# Patient Record
Sex: Female | Born: 1995 | Race: Black or African American | Hispanic: No | Marital: Single | Smoking: Never smoker
Health system: Southern US, Community
[De-identification: ages and names within clinical notes are randomized; demographics above are authoritative.]

## PROBLEM LIST (undated history)

## (undated) DIAGNOSIS — G43909 Migraine, unspecified, not intractable, without status migrainosus: Secondary | ICD-10-CM

## (undated) DIAGNOSIS — R45851 Suicidal ideations: Secondary | ICD-10-CM

## (undated) DIAGNOSIS — R519 Headache, unspecified: Secondary | ICD-10-CM

## (undated) DIAGNOSIS — F4310 Post-traumatic stress disorder, unspecified: Secondary | ICD-10-CM

## (undated) DIAGNOSIS — R51 Headache: Secondary | ICD-10-CM

## (undated) DIAGNOSIS — F329 Major depressive disorder, single episode, unspecified: Secondary | ICD-10-CM

## (undated) DIAGNOSIS — M419 Scoliosis, unspecified: Secondary | ICD-10-CM

## (undated) DIAGNOSIS — F419 Anxiety disorder, unspecified: Secondary | ICD-10-CM

## (undated) HISTORY — DX: Suicidal ideations: R45.851

## (undated) HISTORY — DX: Major depressive disorder, single episode, unspecified: F32.9

## (undated) HISTORY — DX: Headache, unspecified: R51.9

## (undated) HISTORY — DX: Post-traumatic stress disorder, unspecified: F43.10

## (undated) HISTORY — DX: Migraine, unspecified, not intractable, without status migrainosus: G43.909

## (undated) HISTORY — DX: Anxiety disorder, unspecified: F41.9

## (undated) HISTORY — DX: Headache: R51

---
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
## 2017-01-01 ENCOUNTER — Encounter (HOSPITAL_COMMUNITY): Payer: Self-pay

## 2017-01-01 ENCOUNTER — Emergency Department (HOSPITAL_COMMUNITY)
Admission: EM | Admit: 2017-01-01 | Discharge: 2017-01-01 | Disposition: A | Discharge status: Home / Self Care | Payer: Federal, State, Local not specified - PPO | Attending: Emergency Medicine | Admitting: Emergency Medicine

## 2017-01-01 DIAGNOSIS — R1030 Lower abdominal pain, unspecified: Secondary | ICD-10-CM | POA: Insufficient documentation

## 2017-01-01 DIAGNOSIS — F17200 Nicotine dependence, unspecified, uncomplicated: Secondary | ICD-10-CM | POA: Insufficient documentation

## 2017-01-01 LAB — COMPREHENSIVE METABOLIC PANEL
ALBUMIN: 3.8 g/dL (ref 3.5–5.0)
ALT: 12 U/L — ABNORMAL LOW (ref 14–54)
ANION GAP: 6 (ref 5–15)
AST: 19 U/L (ref 15–41)
Alkaline Phosphatase: 51 U/L (ref 38–126)
BUN: 9 mg/dL (ref 6–20)
CHLORIDE: 104 mmol/L (ref 101–111)
CO2: 26 mmol/L (ref 22–32)
Calcium: 9.1 mg/dL (ref 8.9–10.3)
Creatinine, Ser: 0.8 mg/dL (ref 0.44–1.00)
GFR calc non Af Amer: >60 mL/min (ref >60)
GLUCOSE: 97 mg/dL (ref 65–99)
Potassium: 4.1 mmol/L (ref 3.5–5.1)
SODIUM: 136 mmol/L (ref 135–145)
Total Bilirubin: 0.6 mg/dL (ref 0.3–1.2)
Total Protein: 7.4 g/dL (ref 6.5–8.1)

## 2017-01-01 LAB — URINALYSIS, ROUTINE W REFLEX MICROSCOPIC
Bilirubin Urine: NEGATIVE
Glucose, UA: NEGATIVE mg/dL
HGB URINE DIPSTICK: NEGATIVE
Ketones, ur: NEGATIVE mg/dL
Leukocytes, UA: NEGATIVE
Nitrite: NEGATIVE
PH: 6 (ref 5.0–8.0)
Protein, ur: NEGATIVE mg/dL
SPECIFIC GRAVITY, URINE: 1.02 (ref 1.005–1.030)

## 2017-01-01 LAB — I-STAT BETA HCG BLOOD, ED (MC, WL, AP ONLY): I-stat hCG, quantitative: <5 m[IU]/mL (ref <5)

## 2017-01-01 LAB — CBC
HCT: 39 % (ref 36.0–46.0)
HEMOGLOBIN: 12.7 g/dL (ref 12.0–15.0)
MCH: 25.1 pg — AB (ref 26.0–34.0)
MCHC: 32.6 g/dL (ref 30.0–36.0)
MCV: 77.1 fL — ABNORMAL LOW (ref 78.0–100.0)
Platelets: 192 10*3/uL (ref 150–400)
RBC: 5.06 MIL/uL (ref 3.87–5.11)
RDW: 14.3 % (ref 11.5–15.5)
WBC: 5.2 10*3/uL (ref 4.0–10.5)

## 2017-01-01 LAB — LIPASE, BLOOD: Lipase: 29 U/L (ref 11–51)

## 2017-01-01 MED ORDER — KETOROLAC TROMETHAMINE 30 MG/ML IJ SOLN
15.0000 mg | Freq: Once | INTRAMUSCULAR | Status: AC
  Administered 2017-01-01: 15 mg via INTRAMUSCULAR
  Filled 2017-01-01: qty 1

## 2017-01-01 MED ORDER — TRAMADOL HCL 50 MG PO TABS
50.0000 mg | ORAL_TABLET | Freq: Once | ORAL | Status: AC
  Administered 2017-01-01: 50 mg via ORAL
  Filled 2017-01-01: qty 1

## 2017-01-01 MED ORDER — TRAMADOL HCL 50 MG PO TABS: 50.0000 mg | ORAL_TABLET | Freq: Four times a day (QID) | ORAL | 0 refills | Status: DC | PRN

## 2017-01-01 NOTE — Discharge Instructions (Signed)
As discussed, today's evaluation has been generally r reassuring, but with concern for your abdominal pain, it is very important he follow-up with our [facility] colleagues.  When you contact our [facility] clinic tomorrow, please be sure to inform them that you were seen in the emergency department today for lower abdominal pain, and was consideration of your ongoing dysmenorrhea, or irregular menstrual cycle the recommendation is for follow-up and consideration of pelvic ultrasound with our gynecology colleagues.  In addition, please be sure to follow a gluten-free diet, and follow-up with our gastroenterology colleagues.  Return here for concerning changes in your condition.
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
## 2017-01-01 NOTE — ED Triage Notes (Signed)
Pt states that she has had lower abd pain for the two weeks, RLQ, with some nausea, pt states that she has not had a period in four months, c/o of dysuria and abnormal discharge.

## 2017-01-10 ENCOUNTER — Encounter (HOSPITAL_COMMUNITY): Payer: Self-pay | Admitting: *Deleted

## 2017-01-10 ENCOUNTER — Emergency Department (HOSPITAL_COMMUNITY)
Admission: EM | Admit: 2017-01-10 | Discharge: 2017-01-10 | Disposition: A | Discharge status: Home / Self Care | Payer: Federal, State, Local not specified - PPO | Attending: Emergency Medicine | Admitting: Emergency Medicine

## 2017-01-10 ENCOUNTER — Emergency Department (HOSPITAL_COMMUNITY): Payer: Federal, State, Local not specified - PPO

## 2017-01-10 DIAGNOSIS — F17210 Nicotine dependence, cigarettes, uncomplicated: Secondary | ICD-10-CM | POA: Diagnosis not present

## 2017-01-10 DIAGNOSIS — M545 Low back pain: Secondary | ICD-10-CM | POA: Insufficient documentation

## 2017-01-10 DIAGNOSIS — M542 Cervicalgia: Secondary | ICD-10-CM

## 2017-01-10 DIAGNOSIS — Z041 Encounter for examination and observation following transport accident: Secondary | ICD-10-CM | POA: Diagnosis not present

## 2017-01-10 MED ORDER — IBUPROFEN 400 MG PO TABS
600.0000 mg | ORAL_TABLET | Freq: Once | ORAL | Status: AC
  Administered 2017-01-10: 600 mg via ORAL
  Filled 2017-01-10: qty 1

## 2017-01-10 MED ORDER — CYCLOBENZAPRINE HCL 10 MG PO TABS: 10.0000 mg | ORAL_TABLET | Freq: Two times a day (BID) | ORAL | 0 refills | Status: DC | PRN

## 2017-01-10 NOTE — ED Notes (Signed)
Patient transported to CT 
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
## 2017-01-10 NOTE — Discharge Instructions (Signed)
Please read attached information. If you experience any new or worsening signs or symptoms please return to the emergency room for evaluation. Please follow-up with your primary care provider or specialist as discussed. Please use medication prescribed only as directed and discontinue taking if you have any concerning signs or symptoms.   °

## 2017-01-10 NOTE — ED Notes (Signed)
Pt verbalized understanding discharge instructions and denies any further needs or questions at this time. VS stable, ambulatory and steady gait.   

## 2017-11-01 ENCOUNTER — Encounter (HOSPITAL_COMMUNITY): Payer: Self-pay

## 2017-11-01 ENCOUNTER — Emergency Department (HOSPITAL_COMMUNITY): Payer: Federal, State, Local not specified - PPO

## 2017-11-01 ENCOUNTER — Emergency Department (HOSPITAL_COMMUNITY)
Admission: EM | Admit: 2017-11-01 | Discharge: 2017-11-02 | Disposition: A | Discharge status: Home / Self Care | Payer: Federal, State, Local not specified - PPO | Attending: Emergency Medicine | Admitting: Emergency Medicine

## 2017-11-01 ENCOUNTER — Ambulatory Visit (HOSPITAL_COMMUNITY)
Admission: EM | Admit: 2017-11-01 | Discharge: 2017-11-01 | Disposition: A | Discharge status: Home / Self Care | Payer: Federal, State, Local not specified - PPO

## 2017-11-01 DIAGNOSIS — R51 Headache: Secondary | ICD-10-CM | POA: Insufficient documentation

## 2017-11-01 DIAGNOSIS — Z79899 Other long term (current) drug therapy: Secondary | ICD-10-CM | POA: Insufficient documentation

## 2017-11-01 DIAGNOSIS — R519 Headache, unspecified: Secondary | ICD-10-CM

## 2017-11-01 DIAGNOSIS — R079 Chest pain, unspecified: Secondary | ICD-10-CM | POA: Insufficient documentation

## 2017-11-01 DIAGNOSIS — F17210 Nicotine dependence, cigarettes, uncomplicated: Secondary | ICD-10-CM | POA: Insufficient documentation

## 2017-11-01 DIAGNOSIS — Z7982 Long term (current) use of aspirin: Secondary | ICD-10-CM | POA: Diagnosis not present

## 2017-11-01 DIAGNOSIS — R45851 Suicidal ideations: Secondary | ICD-10-CM | POA: Diagnosis not present

## 2017-11-01 DIAGNOSIS — F331 Major depressive disorder, recurrent, moderate: Secondary | ICD-10-CM | POA: Diagnosis present

## 2017-11-01 LAB — BASIC METABOLIC PANEL
ANION GAP: 9 (ref 5–15)
BUN: 10 mg/dL (ref 6–20)
CALCIUM: 9.4 mg/dL (ref 8.9–10.3)
CO2: 26 mmol/L (ref 22–32)
Chloride: 103 mmol/L (ref 98–111)
Creatinine, Ser: 0.93 mg/dL (ref 0.44–1.00)
GFR calc Af Amer: >60 mL/min (ref >60)
Glucose, Bld: 91 mg/dL (ref 70–99)
Potassium: 4 mmol/L (ref 3.5–5.1)
Sodium: 138 mmol/L (ref 135–145)

## 2017-11-01 LAB — I-STAT TROPONIN, ED: TROPONIN I, POC: 0.02 ng/mL (ref 0.00–0.08)

## 2017-11-01 LAB — CBC
HCT: 42.4 % (ref 36.0–46.0)
HEMOGLOBIN: 13 g/dL (ref 12.0–15.0)
MCH: 24.4 pg — AB (ref 26.0–34.0)
MCHC: 30.7 g/dL (ref 30.0–36.0)
MCV: 79.5 fL (ref 78.0–100.0)
PLATELETS: 231 10*3/uL (ref 150–400)
RBC: 5.33 MIL/uL — ABNORMAL HIGH (ref 3.87–5.11)
RDW: 13.7 % (ref 11.5–15.5)
WBC: 5.2 10*3/uL (ref 4.0–10.5)

## 2017-11-01 LAB — I-STAT BETA HCG BLOOD, ED (MC, WL, AP ONLY)

## 2017-11-01 NOTE — ED Notes (Signed)
Pt called this RN back into room prior to going to lobby, states that she was not honest during questionnaire and that she often has thoughts of harming herself, has no plan, denies HI/AVH. Pt states that she has trouble getting out of the bed, has been diagnosed with depression and is not currently on any medication.

## 2017-11-01 NOTE — ED Notes (Signed)
Belongings placed in locker 3 (Pod F)

## 2017-11-01 NOTE — ED Triage Notes (Signed)
Pt states that for the past two weeks she has been having migraines with photosensitivity and nausea, sharp shooting CP, denies SOB, lower abd pain, has not had a period in 4 months but has not taken a pregnancy test.

## 2017-11-02 ENCOUNTER — Encounter (HOSPITAL_COMMUNITY): Payer: Self-pay | Admitting: Registered Nurse

## 2017-11-02 ENCOUNTER — Emergency Department (HOSPITAL_COMMUNITY): Payer: Federal, State, Local not specified - PPO

## 2017-11-02 LAB — RAPID URINE DRUG SCREEN, HOSP PERFORMED
AMPHETAMINES: NOT DETECTED
BARBITURATES: NOT DETECTED
Benzodiazepines: NOT DETECTED
Cocaine: NOT DETECTED
Opiates: NOT DETECTED
TETRAHYDROCANNABINOL: NOT DETECTED

## 2017-11-02 LAB — TSH: TSH: 5.477 u[IU]/mL — AB (ref 0.350–4.500)

## 2017-11-02 LAB — ETHANOL

## 2017-11-02 LAB — T4, FREE: FREE T4: 1.14 ng/dL (ref 0.82–1.77)

## 2017-11-02 LAB — SALICYLATE LEVEL: Salicylate Lvl: <7 mg/dL (ref 2.8–30.0)

## 2017-11-02 LAB — ACETAMINOPHEN LEVEL: Acetaminophen (Tylenol), Serum: <10 ug/mL — ABNORMAL LOW (ref 10–30)

## 2017-11-02 MED ORDER — KETOROLAC TROMETHAMINE 60 MG/2ML IM SOLN
60.0000 mg | Freq: Once | INTRAMUSCULAR | Status: AC
  Administered 2017-11-02: 60 mg via INTRAMUSCULAR
  Filled 2017-11-02: qty 2

## 2017-11-02 MED ORDER — DIPHENHYDRAMINE HCL 25 MG PO CAPS
25.0000 mg | ORAL_CAPSULE | Freq: Once | ORAL | Status: AC
  Administered 2017-11-02: 25 mg via ORAL
  Filled 2017-11-02: qty 1

## 2017-11-02 MED ORDER — PROCHLORPERAZINE MALEATE 5 MG PO TABS
10.0000 mg | ORAL_TABLET | Freq: Once | ORAL | Status: AC
  Administered 2017-11-02: 10 mg via ORAL
  Filled 2017-11-02: qty 2
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
## 2017-11-02 NOTE — ED Notes (Signed)
TTS bedside 

## 2017-11-02 NOTE — ED Notes (Signed)
Patient's friend called to get key for her house to let her dog out. Patient gave verbal consent for [facility] school teachernurse secretary, to go to locker 3 in pod F to obtain key to house and give said key to her friend.

## 2017-11-02 NOTE — ED Notes (Signed)
Discharge instructions discussed with Pt. Pt verbalized understanding. Pt stable and ambulatory.    

## 2017-11-02 NOTE — ED Notes (Signed)
Per Dr. Dalene SeltzerSchlossman, it is okay for patient to eat. Will put in diet order and give patient Malaw[place]key sandwich while waiting for tray from cafeteria.
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
## 2017-11-02 NOTE — ED Notes (Signed)
Patient transported to CT 

## 2017-11-02 NOTE — ED Notes (Signed)
Belongings envelope opened so pt friend could take house keys with her, new envelope created with a new pt signature and taken to security.

## 2017-11-02 NOTE — ED Notes (Signed)
Pt returned from CT °

## 2017-11-02 NOTE — ED Notes (Signed)
This RN attempting to assess patient asked the question, "Do you feel like hurting yourself at this moment?" Patient states no. RN asked "Has anything happened at home recently that could cause you to be stressed?" Patient stated "I don't want to talk about that right now." Currently denies SI/HI.

## 2017-11-02 NOTE — Consult Note (Signed)
Tele psych Assessment   Sherwood GamblerJasmine [name], 22 y.o., female patient seen via tele psych by TTS and this provider; chart reviewed and consulted with Dr. Lucianne MussKumar on 11/02/17.  On evaluation [name] [name] reports that she does have some depression and school is the major stressor.  "They only let me take 12 credit hours this semester and that is going to make it so I can't graduated this year.  I already had plans for graduate school and I had 18 credit hours last semester and made A's and B's so I don't see why they did let me do the 18 credits this semester and the classes are only offered once a year."  Patient states that she came to the hospital because of headache, back pain, and chest pain.  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  Patient states that her mother is supportive and she has one friend on campus but she lives alone in an off campus apartment.  Patient denies suicidal/self-harm/homicidal ideation, psychosis, and paranoia.  Patient is currently seeing a therapist that she has seen 3-4 times.  Other than therapy no prior psychiatric history.  Patient has elevation in TSH.  Patient asked prior issues with her thyroid or family history.  Patient states that she doesn't thing she  Has ever had any problems with thyroid but her grandmother and an aunt has thyroid issues.    During evaluation [name] [name] is alert/oriented x 4; calm/cooperative; and mood is congruent with affect.  She does not appear to be responding to internal/external stimuli or delusional thoughts; and denies suicidal/self-harm/homicidal ideation, psychosis, and paranoia.  Patient continues to complaint of headache having to be in a dark quite room.  States that she has not been given any medication for the pain related to being on psych hold.  Informed patient I would inform EDP of her pain.  Patient also states that she hasn't had a menstrual cycle for 4 months. Lab HCG < 5. [name] [name] unsure if patient has  been informed or results.  Patient states that she doesn't have a physician in area only at home and not sure if campus clinic.  Patient informed to contact her mother; and have check if physician in area covered by insurance so she could follow up with thyroid.  Patient also informed that it would be good to rule out thyroid issues which could also be causing her to feel the way she does; but would still refer to psychiatry.  Patient able to contract for safety and answered question appropriately.  Results for orders placed or performed during the hospital encounter of 11/01/17 (from the past 24 hour(s))  Basic metabolic panel     Status: None   Collection Time: 11/01/17 10:23 PM  Result Value Ref Range   Sodium 138 135 - 145 mmol/L   Potassium 4.0 3.5 - 5.1 mmol/L   Chloride 103 98 - 111 mmol/L   CO2 26 22 - 32 mmol/L   Glucose, Bld 91 70 - 99 mg/dL   BUN 10 6 - 20 mg/dL   Creatinine, Ser 9.600.93 0.44 - 1.00 mg/dL   Calcium 9.4 8.9 - 45.410.3 mg/dL   GFR calc non Af Amer >60 >60 mL/min   GFR calc Af Amer >60 >60 mL/min   Anion gap 9 5 - 15  CBC     Status: Abnormal   Collection Time: 11/01/17 10:23 PM  Result Value Ref Range   WBC 5.2 4.0 - 10.5  K/uL   RBC 5.33 (H) 3.87 - 5.11 MIL/uL   Hemoglobin 13.0 12.0 - 15.0 g/dL   HCT 95.2 84.1 - 32.4 %   MCV 79.5 78.0 - 100.0 fL   MCH 24.4 (L) 26.0 - 34.0 pg   MCHC 30.7 30.0 - 36.0 g/dL   RDW 40.1 02.7 - 25.3 %   Platelets 231 150 - 400 K/uL  Ethanol     Status: None   Collection Time: 11/01/17 10:31 PM  Result Value Ref Range   Alcohol, Ethyl (B) <10 <10 mg/dL  Salicylate level     Status: None   Collection Time: 11/01/17 10:31 PM  Result Value Ref Range   Salicylate Lvl <7.0 2.8 - 30.0 mg/dL  Acetaminophen level     Status: Abnormal   Collection Time: 11/01/17 10:31 PM  Result Value Ref Range   Acetaminophen (Tylenol), Serum <10 (L) 10 - 30 ug/mL  Rapid urine drug screen (hospital performed)     Status: None   Collection Time: 11/01/17  10:31 PM  Result Value Ref Range   Opiates NONE DETECTED NONE DETECTED   Cocaine NONE DETECTED NONE DETECTED   Benzodiazepines NONE DETECTED NONE DETECTED   Amphetamines NONE DETECTED NONE DETECTED   Tetrahydrocannabinol NONE DETECTED NONE DETECTED   Barbiturates NONE DETECTED NONE DETECTED  I-Stat beta hCG blood, ED     Status: None   Collection Time: 11/01/17 10:41 PM  Result Value Ref Range   I-stat hCG, quantitative <5.0 <5 mIU/mL   Comment 3          I-stat troponin, ED     Status: None   Collection Time: 11/01/17 10:42 PM  Result Value Ref Range   Troponin i, poc 0.02 0.00 - 0.08 ng/mL   Comment 3          TSH     Status: Abnormal   Collection Time: 11/02/17  8:47 AM  Result Value Ref Range   TSH 5.477 (H) 0.350 - 4.500 uIU/mL    For detailed note see TTS tele assessment note  Recommendations:  Continue with therapy, referral to psychiatry for medication management after thyroid issues ruled out.  Patient requesting medication for severe headache; if no medical con  Disposition:  Patient is psychiatrically cleared No evidence of imminent risk to self or others at present.   Patient does not meet criteria for psychiatric inpatient admission. Supportive therapy provided about ongoing stressors. Discussed crisis plan, support from social network, calling 911, coming to the Emergency Department, and calling Suicide Hotline.   Attempted to speak with EDP; Dr. Dalene Seltzer who was unable to come to phone at time and was put on hold 11:44 AM - 11:49 AM.  Called to speak with RN of patient and put on hold again 11:50 AM - 11: 52 -11:54 AM.  Sherron Monday with Jenel Lucks, RN who took the phone to Dr. Dalene Seltzer: at which time she was informed of above recommendation and disposition.  Report completed at 12:01 PM   [name] Found, NP

## 2017-11-03 LAB — T3, FREE: T3, Free: 3.5 pg/mL (ref 2.0–4.4)

## 2018-01-29 ENCOUNTER — Encounter: Payer: Self-pay | Admitting: Neurology

## 2018-01-29 ENCOUNTER — Encounter

## 2018-01-29 ENCOUNTER — Encounter: Payer: Self-pay | Admitting: *Deleted

## 2018-01-29 ENCOUNTER — Telehealth: Payer: Self-pay | Admitting: *Deleted

## 2018-01-29 ENCOUNTER — Ambulatory Visit: Payer: Federal, State, Local not specified - PPO | Admitting: Neurology

## 2018-01-29 VITALS — BP 124/79 | HR 86 | Ht 62.0 in | Wt 122.0 lb

## 2018-01-29 DIAGNOSIS — G43711 Chronic migraine without aura, intractable, with status migrainosus: Secondary | ICD-10-CM

## 2018-01-29 MED ORDER — ONDANSETRON 4 MG PO TBDP: 4.0000 mg | ORAL_TABLET | Freq: Three times a day (TID) | ORAL | 3 refills | Status: DC | PRN

## 2018-01-29 MED ORDER — RIZATRIPTAN BENZOATE 10 MG PO TBDP: 10.0000 mg | ORAL_TABLET | ORAL | 11 refills | Status: DC | PRN

## 2018-01-29 MED ORDER — VENLAFAXINE HCL ER 37.5 MG PO TB24: 37.5000 mg | ORAL_TABLET | Freq: Every day | ORAL | 6 refills | Status: DC

## 2018-01-29 NOTE — Patient Instructions (Addendum)
For prevention: Venlafaxine (Effexor): 37.5mg  daily. In 2-3 weeks email me and can increase to 75 I also like these medications for prevention: Topamax and propranolol also good oral meds but can worsen depression Aimovig - once monthly injection (new) at home Botox for migraines - every 3 months come to the office. Has been used for depression as well.  Acute management (when you have a migraine)  Rizatriptan: Please take one tablet at the onset of your headache. If it does not improve the symptoms please take one additional tablet. Do not take more then 2 tablets in 24hrs. Do not take use more then 2 to 3 times in a week. May take with zofran (ondansetron) for nausea.   Venlafaxine tablets What is this medicine? VENLAFAXINE (VEN la fax een) is used to treat depression, anxiety and panic disorder. This medicine may be used for other purposes; ask your health care provider or pharmacist if you have questions. COMMON BRAND NAME(S): Effexor What should I tell my health care provider before I take this medicine? They need to know if you have any of these conditions: -bleeding disorders -glaucoma -heart disease -high blood pressure -high cholesterol -kidney disease -liver disease -low levels of sodium in the blood -mania or bipolar disorder -seizures -suicidal thoughts, plans, or attempt; a previous suicide attempt by you or a family -take medicines that treat or prevent blood clots -thyroid disease -an unusual or allergic reaction to venlafaxine, desvenlafaxine, other medicines, foods, dyes, or preservatives -pregnant or trying to get pregnant -breast-feeding How should I use this medicine? Take this medicine by mouth with a glass of water. Follow the directions on the prescription label. Take it with food. Take your medicine at regular intervals. Do not take your medicine more often than directed. Do not stop taking this medicine suddenly except upon the advice of your doctor.  Stopping this medicine too quickly may cause serious side effects or your condition may worsen. A special MedGuide will be given to you by the pharmacist with each prescription and refill. Be sure to read this information carefully each time. Talk to your pediatrician regarding the use of this medicine in children. Special care may be needed. Overdosage: If you think you have taken too much of this medicine contact a poison control center or emergency room at once. NOTE: This medicine is only for you. Do not share this medicine with others. What if I miss a dose? If you miss a dose, take it as soon as you can. If it is almost time for your next dose, take only that dose. Do not take double or extra doses. What may interact with this medicine? Do not take this medicine with any of the following medications: -certain medicines for fungal infections like fluconazole, itraconazole, ketoconazole, posaconazole, voriconazole -cisapride -desvenlafaxine -dofetilide -dronedarone -duloxetine -levomilnacipran -linezolid -MAOIs like Carbex, Eldepryl, Marplan, Nardil, and Parnate -methylene blue (injected into a vein) -milnacipran -pimozide -thioridazine -ziprasidone This medicine may also interact with the following medications: -amphetamines -aspirin and aspirin-like medicines -certain medicines for depression, anxiety, or psychotic disturbances -certain medicines for migraine headaches like almotriptan, eletriptan, frovatriptan, naratriptan, rizatriptan, sumatriptan, zolmitriptan -certain medicines for sleep -certain medicines that treat or prevent blood clots like dalteparin, enoxaparin, warfarin -cimetidine -clozapine -diuretics -fentanyl -furazolidone -indinavir -isoniazid -lithium -metoprolol -NSAIDS, medicines for pain and inflammation, like ibuprofen or naproxen -other medicines that prolong the QT interval (cause an abnormal heart rhythm) -procarbazine -rasagiline -supplements  like St. John's wort, kava kava, valerian -tramadol -tryptophan This list may  not describe all possible interactions. Give your health care provider a list of all the medicines, herbs, non-prescription drugs, or dietary supplements you use. Also tell them if you smoke, drink alcohol, or use illegal drugs. Some items may interact with your medicine. What should I watch for while using this medicine? Tell your doctor if your symptoms do not get better or if they get worse. Visit your doctor or health care professional for regular checks on your progress. Because it may take several weeks to see the full effects of this medicine, it is important to continue your treatment as prescribed by your doctor. Patients and their families should watch out for new or worsening thoughts of suicide or depression. Also watch out for sudden changes in feelings such as feeling anxious, agitated, panicky, irritable, hostile, aggressive, impulsive, severely restless, overly excited and hyperactive, or not being able to sleep. If this happens, especially at the beginning of treatment or after a change in dose, call your health care professional. This medicine can cause an increase in blood pressure. Check with your doctor for instructions on monitoring your blood pressure while taking this medicine. You may get drowsy or dizzy. Do not drive, use machinery, or do anything that needs mental alertness until you know how this medicine affects you. Do not stand or sit up quickly, especially if you are an older patient. This reduces the risk of dizzy or fainting spells. Alcohol may interfere with the effect of this medicine. Avoid alcoholic drinks. Your mouth may get dry. Chewing sugarless gum, sucking hard candy and drinking plenty of water will help. Contact your doctor if the problem does not go away or is severe. What side effects may I notice from receiving this medicine? Side effects that you should report to your doctor or  health care professional as soon as possible: -allergic reactions like skin rash, itching or hives, swelling of the face, lips, or tongue -anxious -breathing problems -confusion -changes in vision -chest pain -confusion -elevated mood, decreased need for sleep, racing thoughts, impulsive behavior -eye pain -fast, irregular heartbeat -feeling faint or lightheaded, falls -feeling agitated, angry, or irritable -hallucination, loss of contact with reality -high blood pressure -loss of balance or coordination -palpitations -redness, blistering, peeling or loosening of the skin, including inside the mouth -restlessness, pacing, inability to keep still -seizures -stiff muscles -suicidal thoughts or other mood changes -trouble passing urine or change in the amount of urine -trouble sleeping -unusual bleeding or bruising -unusually weak or tired -vomiting Side effects that usually do not require medical attention (report to your doctor or health care professional if they continue or are bothersome): -change in sex drive or performance -change in appetite or weight -constipation -dizziness -dry mouth -headache -increased sweating -nausea -tired This list may not describe all possible side effects. Call your doctor for medical advice about side effects. You may report side effects to FDA at 1-800-FDA-1088. Where should I keep my medicine? Keep out of the reach of children. Store at a controlled temperature between 20 and 25 degrees C (68 and 77 degrees F), in a dry place. Throw away any unused medicine after the expiration date. NOTE: This sheet is a summary. It may not cover all possible information. If you have questions about this medicine, talk to your doctor, pharmacist, or health care provider.  2018 [employer]/Gold Standard (2015-07-09 18:42:26)  Rizatriptan disintegrating tablets What is this medicine? RIZATRIPTAN (rye za TRIP tan) is used to treat migraines with or without  aura. An  aura is a strange feeling or visual disturbance that warns you of an attack. It is not used to prevent migraines. This medicine may be used for other purposes; ask your health care provider or pharmacist if you have questions. COMMON BRAND NAME(S): Maxalt-MLT What should I tell my health care provider before I take this medicine? They need to know if you have any of these conditions: -bowel disease or colitis -diabetes -family history of heart disease -fast or irregular heart beat -heart or blood vessel disease, angina (chest pain), or previous heart attack -high blood pressure -high cholesterol -history of stroke, transient ischemic attacks (TIAs or mini-strokes), or intracranial bleeding -kidney or liver disease -overweight -poor circulation -postmenopausal or surgical removal of uterus and ovaries -an unusual or allergic reaction to rizatriptan, other medicines, foods, dyes, or preservatives -pregnant or trying to get pregnant -breast-feeding How should I use this medicine? Take this medicine by mouth. Follow the directions on the prescription label. This medicine is taken at the first symptoms of a migraine. It is not for everyday use. Leave the tablet in the foil package until you are ready to take it. Do not push the tablet through the blister pack. Peel open the blister pack with dry hands and place the tablet on your tongue. The tablet will dissolve rapidly and be swallowed in your saliva. It is not necessary to drink any water to take this medicine. If your migraine headache returns after one dose, you can take another dose as directed. You must leave at least 2 hours between doses, and do not take more than 30 mg total in 24 hours. If there is no improvement at all after the first dose, do not take a second dose without talking to your doctor or health care professional. Do not take your medicine more often than directed. Talk to your pediatrician regarding the use of this  medicine in children. While this drug may be prescribed for children as young as 6 years for selected conditions, precautions do apply. Overdosage: If you think you have taken too much of this medicine contact a poison control center or emergency room at once. NOTE: This medicine is only for you. Do not share this medicine with others. What if I miss a dose? This does not apply; this medicine is not for regular use. What may interact with this medicine? Do not take this medicine with any of the following medicines: -amphetamine, dextroamphetamine or cocaine -dihydroergotamine, ergotamine, ergoloid mesylates, methysergide, or ergot-type medication - do not take within 24 hours of taking rizatriptan -feverfew -MAOIs like Carbex, Eldepryl, Marplan, Nardil, and Parnate - do not take rizatriptan within 2 weeks of stopping MAOI therapy. -other migraine medicines like almotriptan, eletriptan, naratriptan, sumatriptan, zolmitriptan - do not take within 24 hours of taking rizatriptan -tryptophan This medicine may also interact with the following medications: -medicines for mental depression, anxiety or mood problems -propranolol This list may not describe all possible interactions. Give your health care provider a list of all the medicines, herbs, non-prescription drugs, or dietary supplements you use. Also tell them if you smoke, drink alcohol, or use illegal drugs. Some items may interact with your medicine. What should I watch for while using this medicine? Only take this medicine for a migraine headache. Take it if you get warning symptoms or at the start of a migraine attack. It is not for regular use to prevent migraine attacks. You may get drowsy or dizzy. Do not drive, use machinery, or do anything that  needs mental alertness until you know how this medicine affects you. To reduce dizzy or fainting spells, do not sit or stand up quickly, especially if you are an older patient. Alcohol can increase  drowsiness, dizziness and flushing. Avoid alcoholic drinks. Smoking cigarettes may increase the risk of heart-related side effects from using this medicine. If you take migraine medicines for 10 or more days a month, your migraines may get worse. Keep a diary of headache days and medicine use. Contact your healthcare professional if your migraine attacks occur more frequently. What side effects may I notice from receiving this medicine? Side effects that you should report to your doctor or health care professional as soon as possible: -allergic reactions like skin rash, itching or hives, swelling of the face, lips, or tongue -fast, slow, or irregular heart beat -increased or decreased blood pressure -seizures -severe stomach pain and cramping, bloody diarrhea -signs and symptoms of a blood clot such as breathing problems; changes in vision; chest pain; severe, sudden headache; pain, swelling, warmth in the leg; trouble speaking; sudden numbness or weakness of the face, arm or leg -tingling, pain, or numbness in the face, hands, or feet Side effects that usually do not require medical attention (report to your doctor or health care professional if they continue or are bothersome): -drowsiness -dry mouth -feeling warm, flushing, or redness of the face -headache -muscle cramps, pain -nausea, vomiting -unusually weak or tired This list may not describe all possible side effects. Call your doctor for medical advice about side effects. You may report side effects to FDA at 1-800-FDA-1088. Where should I keep my medicine? Keep out of the reach of children. Store at room temperature between 15 and 30 degrees C (59 and 86 degrees F). Protect from light and moisture. Throw away any unused medicine after the expiration date. NOTE: This sheet is a summary. It may not cover all possible information. If you have questions about this medicine, talk to your doctor, pharmacist, or health care provider.   2018 Elsevier/Gold Standard (2012-10-09 10:17:42) Ondansetron oral dissolving tablet What is this medicine? ONDANSETRON (on DAN se tron) is used to treat nausea and vomiting caused by chemotherapy. It is also used to prevent or treat nausea and vomiting after surgery. This medicine may be used for other purposes; ask your health care provider or pharmacist if you have questions. COMMON BRAND NAME(S): Zofran ODT What should I tell my health care provider before I take this medicine? They need to know if you have any of these conditions: -heart disease -history of irregular heartbeat -liver disease -low levels of magnesium or potassium in the blood -an unusual or allergic reaction to ondansetron, granisetron, other medicines, foods, dyes, or preservatives -pregnant or trying to get pregnant -breast-feeding How should I use this medicine? These tablets are made to dissolve in the mouth. Do not try to push the tablet through the foil backing. With dry hands, peel away the foil backing and gently remove the tablet. Place the tablet in the mouth and allow it to dissolve, then swallow. While you may take these tablets with water, it is not necessary to do so. Talk to your pediatrician regarding the use of this medicine in children. Special care may be needed. Overdosage: If you think you have taken too much of this medicine contact a poison control center or emergency room at once. NOTE: This medicine is only for you. Do not share this medicine with others. What if I miss a dose? If you  miss a dose, take it as soon as you can. If it is almost time for your next dose, take only that dose. Do not take double or extra doses. What may interact with this medicine? Do not take this medicine with any of the following medications: -apomorphine -certain medicines for fungal infections like fluconazole, itraconazole, ketoconazole, posaconazole,  voriconazole -cisapride -dofetilide -dronedarone -pimozide -thioridazine -ziprasidone This medicine may also interact with the following medications: -carbamazepine -certain medicines for depression, anxiety, or psychotic disturbances -fentanyl -linezolid -MAOIs like Carbex, Eldepryl, Marplan, Nardil, and Parnate -methylene blue (injected into a vein) -other medicines that prolong the QT interval (cause an abnormal heart rhythm) -phenytoin -rifampicin -tramadol This list may not describe all possible interactions. Give your health care provider a list of all the medicines, herbs, non-prescription drugs, or dietary supplements you use. Also tell them if you smoke, drink alcohol, or use illegal drugs. Some items may interact with your medicine. What should I watch for while using this medicine? Check with your doctor or health care professional as soon as you can if you have any sign of an allergic reaction. What side effects may I notice from receiving this medicine? Side effects that you should report to your doctor or health care professional as soon as possible: -allergic reactions like skin rash, itching or hives, swelling of the face, lips, or tongue -breathing problems -confusion -dizziness -fast or irregular heartbeat -feeling faint or lightheaded, falls -fever and chills -loss of balance or coordination -seizures -sweating -swelling of the hands and feet -tightness in the chest -tremors -unusually weak or tired Side effects that usually do not require medical attention (report to your doctor or health care professional if they continue or are bothersome): -constipation or diarrhea -headache This list may not describe all possible side effects. Call your doctor for medical advice about side effects. You may report side effects to FDA at 1-800-FDA-1088. Where should I keep my medicine? Keep out of the reach of children. Store between 2 and 30 degrees C (36 and 86  degrees F). Throw away any unused medicine after the expiration date. NOTE: This sheet is a summary. It may not cover all possible information. If you have questions about this medicine, talk to your doctor, pharmacist, or health care provider.  2018 Elsevier/Gold Standard (2012-11-14 16:21:52)

## 2018-01-29 NOTE — Telephone Encounter (Signed)
Pt no showed new pt appt on 01/29/2018 @ 09:00.

## 2018-01-29 NOTE — Progress Notes (Signed)
[facility]    Provider:  Dr Lucia Gaskins Referring Provider: Maryagnes Amos, FNP Primary Care Physician:  Maryagnes Amos, FNP  CC:  migraines  HPI:  [name] [name] is a 22 y.o. female here as requested by Maryagnes Amos, FNP for headaches.  She has a past medical history of migraines, depression, SI.  She has never smoked, she is a social drinker.  Migraine started 6 to 10 years ago.  They started on the left forehead, pulsating throbbing, "excruciating".  She describes photo and phonophobia, nausea, movement makes it worse, headaches are moderately severe to severe.  Headaches have lasted 24 to 72 hours.  Patient takes Tylenol 3 for her headaches. Migraines are daily now for 6 months. She also has pain in the back of the head. Mother had headaches. Grandmother has migraines. No OTC meds bc they don;t work. No aura. No other focal neurologic deficits, associated symptoms, inciting events or modifiable factors.  Reviewed notes, labs and imaging from outside physicians, which showed:  Tried: Elavil. Tramadol. Tylenol 3. Lexapro. Diet changes, flexeril, esmolol(B blocker like propranolol), sumatriptan  Reviewed labs, taken November 20, 2017.  Unremarkable may be some mild microcytic anemia as MCV is low, BUN is 10, creatinine is 0.74, TSH is normal 2.28.  Reviewed notes from Maryagnes Amos, FNP: Patient was seen after hospitalization for headache chest pain and suicidal ideation.  Patient was last seen in September of this year.  Patient has a history of migraine headaches.  Location are at the left forehead.  Described as achy sharp and "excruciating".  They started 6 to 10 years ago.  She has multiple headaches a day.  She also complains of ear ringing in the right ear.  Whistling and pulsatile.      CT showed No acute intracranial abnormalities including mass lesion or mass effect, hydrocephalus, extra-axial fluid collection, midline shift,  hemorrhage, or acute infarction, large ischemic events (personally reviewed images)     Review of Systems: Patient complains of symptoms per HPI as well as the following symptoms: headaches. Pertinent negatives and positives per HPI. All others negative.   Social History   Socioeconomic History  . Marital status: Single    Spouse name: Not on file  . Number of children: Not on file  . Years of education: 15.5  . Highest education level: Some college, no degree  Occupational History  . Not on file  Social Needs  . Financial resource strain: Not on file  . Food insecurity:    Worry: Not on file    Inability: Not on file  . Transportation needs:    Medical: Not on file    Non-medical: Not on file  Tobacco Use  . Smoking status: Never Smoker  . Smokeless tobacco: Never Used  Substance and Sexual Activity  . Alcohol use: Yes    Comment: socially, not even weekly  . Drug use: Not Currently    Types: Marijuana  . Sexual activity: Not on file  Lifestyle  . Physical activity:    Days per week: Not on file    Minutes per session: Not on file  . Stress: Not on file  Relationships  . Social connections:    Talks on phone: Not on file    Gets together: Not on file    Attends religious service: Not on file    Active member of club or organization: Not on file    Attends meetings of clubs or organizations: Not on file  Relationship status: Not on file  . Intimate partner violence:    Fear of current or ex partner: Not on file    Emotionally abused: Not on file    Physically abused: Not on file    Forced sexual activity: Not on file  Other Topics Concern  . Not on file  Social History Narrative   Lives at home with her dog   Right handed   Caffeine: rare    Family History  Problem Relation Age of Onset  . Migraines Mother   . Heart failure Maternal Grandmother   . Diabetes Maternal Grandmother   . Hypertension Maternal Grandmother   . Migraines Maternal  Grandmother   . High Cholesterol Maternal Grandmother   . High Cholesterol Maternal Grandfather   . Diabetes Maternal Grandfather   . Hypertension Maternal Grandfather   . High Cholesterol Paternal Grandmother   . Diabetes Paternal Grandmother   . Hypertension Paternal Grandmother   . Heart failure Paternal Grandfather   . Diabetes Paternal Grandfather   . High Cholesterol Paternal Grandfather   . Migraines Maternal Aunt   . Bipolar disorder Maternal Aunt   . Sarcoidosis Maternal Aunt        eyes, blind   . Bipolar disorder Cousin        mother's side    Past Medical History:  Diagnosis Date  . Anxiety   . Headache   . Major depressive disorder   . Migraine   . Scoliosis   . Suicidal ideation     Past Surgical History:  Procedure Laterality Date  . LAPAROSCOPIC APPENDECTOMY N/A 11/28/2013   Procedure: APPENDECTOMY LAPAROSCOPIC;  Surgeon: Judie Petit. Leonia Corona, MD;  Location: [facility] OR;  Service: Pediatrics;  Laterality: N/A;    Current Outpatient Medications  Medication Sig Dispense Refill  . amitriptyline (ELAVIL) 25 MG tablet Take 25 mg by mouth at bedtime.    Marland Kitchen escitalopram (LEXAPRO) 5 MG tablet Take 5 mg by mouth at bedtime.    . ondansetron (ZOFRAN-ODT) 4 MG disintegrating tablet Take 1 tablet (4 mg total) by mouth every 8 (eight) hours as needed for nausea. 30 tablet 3  . rizatriptan (MAXALT-MLT) 10 MG disintegrating tablet Take 1 tablet (10 mg total) by mouth as needed for migraine. May repeat in 2 hours if needed 9 tablet 11  . Venlafaxine HCl 37.5 MG TB24 Take 1 tablet (37.5 mg total) by mouth daily. 30 each 6   No current facility-administered medications for this visit.     Allergies as of 01/29/2018 - Review Complete 01/29/2018  Allergen Reaction Noted  . Bee venom Anaphylaxis 01/29/2018    Vitals: BP 124/79 (BP Location: Right Arm, Patient Position: Sitting)   Pulse 86   Ht 5\' 2"  (1.575 m)   Wt 122 lb (55.3 kg)   LMP 12/30/2017 (Within Weeks)   BMI 22.31  kg/m  Last Weight:  Wt Readings from Last 1 Encounters:  01/29/18 122 lb (55.3 kg)   Last Height:   Ht Readings from Last 1 Encounters:  01/29/18 5\' 2"  (1.575 m)   Physical exam: Exam: Gen: NAD, conversant, well nourised, well groomed                     CV: RRR, no MRG. No Carotid Bruits. No peripheral edema, warm, nontender Eyes: Conjunctivae clear without exudates or hemorrhage  Neuro: Detailed Neurologic Exam  Speech:    Speech is normal; fluent and spontaneous with normal comprehension.  Cognition:  The patient is oriented to person, place, and time;     recent and remote memory intact;     language fluent;     normal attention, concentration,     fund of knowledge Cranial Nerves:    The pupils are equal, round, and reactive to light. The fundi are normal and spontaneous venous pulsations are present. Visual fields are full to finger confrontation. Extraocular movements are intact. Trigeminal sensation is intact and the muscles of mastication are normal. The face is symmetric. The palate elevates in the midline. Hearing intact. Voice is normal. Shoulder shrug is normal. The tongue has normal motion without fasciculations.   Coordination:    Normal finger to nose and heel to shin. Normal rapid alternating movements.   Gait:    Heel-toe and tandem gait are normal.   Motor Observation:    No asymmetry, no atrophy, and no involuntary movements noted. Tone:    Normal muscle tone.    Posture:    Posture is normal. normal erect    Strength:    Strength is V/V in the upper and lower limbs.      Sensation: intact to LT     Reflex Exam:  DTR's:    Deep tendon reflexes in the upper and lower extremities are normal bilaterally.   Toes:    The toes are downgoing bilaterally.   Clonus:    Clonus is absent.       Assessment/Plan:  Patient with chronic migraines and depression  -Patient was given Tylenol 3.  I would advise not to use narcotics in headache  management.  These types of medications can also cause medication rebound overuse headache.    - For prevention: Venlafaxine (Effexor): 37.5mg  daily. In 2-3 weeks email me and can increase to 75. This may help with depression as well. I also like these medications for prevention: Topamax and propranolol also good oral meds but can worsen depression Aimovig - once monthly injection (new) at home Botox for migraines - every 3 months come to the office. Has been used for depression as well.  Acute management (when you have a migraine)  Rizatriptan: Please take one tablet at the onset of your headache. If it does not improve the symptoms please take one additional tablet. Do not take more then 2 tablets in 24hrs. Do not take use more then 2 to 3 times in a week. May take with zofran (ondansetron) for nausea  Discussed: To prevent or relieve headaches, try the following: Cool Compress. Lie down and place a cool compress on your head.  Avoid headache triggers. If certain foods or odors seem to have triggered your migraines in the past, avoid them. A headache diary might help you identify triggers.  Include physical activity in your daily routine. Try a daily walk or other moderate aerobic exercise.  Manage stress. Find healthy ways to cope with the stressors, such as delegating tasks on your to-do list.  Practice relaxation techniques. Try deep breathing, yoga, massage and visualization.  Eat regularly. Eating regularly scheduled meals and maintaining a healthy diet might help prevent headaches. Also, drink plenty of fluids.  Follow a regular sleep schedule. Sleep deprivation might contribute to headaches Consider biofeedback. With this mind-body technique, you learn to control certain bodily functions - such as muscle tension, heart rate and blood pressure - to prevent headaches or reduce headache pain.    Proceed to emergency room if you experience new or worsening symptoms or symptoms do not  resolve,  if you have new neurologic symptoms or if headache is severe, or for any concerning symptom.   Provided education and documentation from American headache Society toolbox including articles on: chronic migraine medication overuse headache, chronic migraines, prevention of migraines, behavioral and other nonpharmacologic treatments for headache.      Naomie DeanAntonia Jonte Shiller, MD  [facility] [address] [place], [place] [id number]  Phone [phone] Fax [phone]

## 2018-05-02 ENCOUNTER — Ambulatory Visit: Payer: Federal, State, Local not specified - PPO | Admitting: Neurology

## 2018-12-04 ENCOUNTER — Encounter: Payer: Self-pay | Admitting: Family Medicine

## 2018-12-04 ENCOUNTER — Other Ambulatory Visit (HOSPITAL_COMMUNITY)
Admission: RE | Admit: 2018-12-04 | Discharge: 2018-12-04 | Disposition: A | Discharge status: Home / Self Care | Payer: Federal, State, Local not specified - PPO | Source: Ambulatory Visit | Attending: Family Medicine | Admitting: Family Medicine

## 2018-12-04 ENCOUNTER — Ambulatory Visit: Payer: Federal, State, Local not specified - PPO | Admitting: Family Medicine

## 2018-12-04 ENCOUNTER — Other Ambulatory Visit: Payer: Self-pay

## 2018-12-04 VITALS — BP 118/83 | HR 88 | Temp 98.5°F | Ht 62.0 in | Wt 123.0 lb

## 2018-12-04 DIAGNOSIS — Z124 Encounter for screening for malignant neoplasm of cervix: Secondary | ICD-10-CM

## 2018-12-04 DIAGNOSIS — R1031 Right lower quadrant pain: Secondary | ICD-10-CM | POA: Diagnosis not present

## 2018-12-04 DIAGNOSIS — Z113 Encounter for screening for infections with a predominantly sexual mode of transmission: Secondary | ICD-10-CM | POA: Diagnosis not present

## 2018-12-04 LAB — POCT CBC
Granulocyte percent: 56.2 %G (ref 37–80)
HCT, POC: 37.6 % (ref 29–41)
Hemoglobin: 12 g/dL (ref 11–14.6)
Lymph, poc: 2.1 (ref 0.6–3.4)
MCH, POC: 24.1 pg — AB (ref 27–31.2)
MCHC: 31.9 g/dL (ref 31.8–35.4)
MCV: 75.7 fL — AB (ref 76–111)
MID (cbc): 0.4 (ref 0–0.9)
MPV: 8.4 fL (ref 0–99.8)
POC Granulocyte: 3.2 (ref 2–6.9)
POC LYMPH PERCENT: 36.8 %L (ref 10–50)
POC MID %: 7 %M (ref 0–12)
Platelet Count, POC: 195 10*3/uL (ref 142–424)
RBC: 4.97 M/uL (ref 4.04–5.48)
RDW, POC: 14.6 %
WBC: 5.7 10*3/uL (ref 4.6–10.2)

## 2018-12-04 LAB — POC MICROSCOPIC URINALYSIS (UMFC): Mucus: ABSENT

## 2018-12-04 LAB — POCT URINALYSIS DIP (MANUAL ENTRY)
Bilirubin, UA: NEGATIVE
Glucose, UA: NEGATIVE mg/dL
Ketones, POC UA: NEGATIVE mg/dL
Leukocytes, UA: NEGATIVE
Nitrite, UA: NEGATIVE
Protein Ur, POC: NEGATIVE mg/dL
Spec Grav, UA: 1.025 (ref 1.010–1.025)
Urobilinogen, UA: 0.2 E.U./dL
pH, UA: 6.5 (ref 5.0–8.0)

## 2018-12-04 LAB — POCT URINE PREGNANCY: Preg Test, Ur: NEGATIVE

## 2018-12-04 NOTE — Patient Instructions (Addendum)
° ° ° °  If you have lab work done today you will be contacted with your lab results within the next 2 weeks.  If you have not heard from us then please contact us. The fastest way to get your results is to register for My Chart. ° ° °IF you received an x-ray today, you will receive an invoice from [facility]. Please contact [facility] at [phone] with questions or concerns regarding your invoice.  ° °IF you received labwork today, you will receive an invoice from [facility]. Please contact [facility] at [phone] with questions or concerns regarding your invoice.  ° °Our billing staff will not be able to assist you with questions regarding bills from these companies. ° °You will be contacted with the lab results as soon as they are available. The fastest way to get your results is to activate your My Chart account. Instructions are located on the last page of this paperwork. If you have not heard from us regarding the results in 2 weeks, please contact this office. °  ° ° ° °

## 2018-12-04 NOTE — Progress Notes (Signed)
10/13/20209:58 AM  [name] [name] Mar 03, 1995, 23 y.o., female [id number]  Chief Complaint  Patient presents with   Abdominal Pain    HPI:   Patient is a 23 y.o. female who presents today for abdominal pain  Patient reports about a month of severe pain, started on LLQ abd pain but now mostly RLQ, makes her cry, very sharp Daily, comes and goes No nausea or vomiting No constipation or diarrhea Last BM yesterday, normal One episode of bright red blood several weeks ago No fever or chills LMP sept 16th, irregular Has h/o cyst of left ovary and fibroids She was advised to have surgery for cyst in 2015 No abnormal vaginal discharge Not recently sexually active Last STD testing a year ago No dysuria, hematuria, urgency, frequency Has not had pap H/o appy  No flowsheet data found.  Fall Risk  12/04/2018  Falls in the past year? 0  Number falls in past yr: 0  Injury with Fall? 0     Allergies  Allergen Reactions   Bee Venom Anaphylaxis    Honey bee    Prior to Admission medications   Not on File    Past Medical History:  Diagnosis Date   Anxiety    Headache    Major depressive disorder    Migraine    Scoliosis    Suicidal ideation     Past Surgical History:  Procedure Laterality Date   LAPAROSCOPIC APPENDECTOMY N/A 11/28/2013   Procedure: APPENDECTOMY LAPAROSCOPIC;  Surgeon: Judie Petit. Leonia Corona, MD;  Location: [facility] OR;  Service: Pediatrics;  Laterality: N/A;    Social History   Tobacco Use   Smoking status: Never Smoker   Smokeless tobacco: Never Used  Substance Use Topics   Alcohol use: Yes    Comment: socially, not even weekly    Family History  Problem Relation Age of Onset   Migraines Mother    Heart failure Maternal Grandmother    Diabetes Maternal Grandmother    Hypertension Maternal Grandmother    Migraines Maternal Grandmother    High Cholesterol Maternal Grandmother    High Cholesterol Maternal Grandfather     Diabetes Maternal Grandfather    Hypertension Maternal Grandfather    High Cholesterol Paternal Grandmother    Diabetes Paternal Grandmother    Hypertension Paternal Grandmother    Heart failure Paternal Grandfather    Diabetes Paternal Grandfather    High Cholesterol Paternal Grandfather    Migraines Maternal Aunt    Bipolar disorder Maternal Aunt    Sarcoidosis Maternal Aunt        eyes, blind    Bipolar disorder Cousin        mother's side    ROS Per hpi  OBJECTIVE:  Today's Vitals   12/04/18 0953  BP: 118/83  Pulse: 88  Temp: 98.5 F (36.9 C)  SpO2: 100%  Weight: 123 lb (55.8 kg)  Height: 5\' 2"  (1.575 m)   Body mass index is 22.5 kg/m.   Physical Exam Vitals signs and nursing note reviewed. Exam conducted with a chaperone present.  Constitutional:      Appearance: She is well-developed.  HENT:     Head: Normocephalic and atraumatic.  Eyes:     General: No scleral icterus.    Conjunctiva/sclera: Conjunctivae normal.     Pupils: Pupils are equal, round, and reactive to light.  Neck:     Musculoskeletal: Neck supple.  Pulmonary:     Effort: Pulmonary effort is normal.  Abdominal:  General: Bowel sounds are normal. There is no distension.     Palpations: Abdomen is soft. There is no hepatomegaly, splenomegaly or mass.     Tenderness: There is generalized abdominal tenderness (most on RLQ). There is no guarding or rebound.     Hernia: No hernia is present.  Genitourinary:    General: Normal vulva.     Vagina: Normal.     Cervix: No cervical motion tenderness, discharge, friability or erythema.     Uterus: Not enlarged, not fixed and not tender.      Adnexa:        Right: Tenderness present. No fullness.         Left: No tenderness or fullness.    Skin:    General: Skin is warm and dry.  Neurological:     Mental Status: She is alert and oriented to person, place, and time.     Results for orders placed or performed in visit on 12/04/18  (from the past 24 hour(s))  POCT CBC     Status: Abnormal   Collection Time: 12/04/18 10:43 AM  Result Value Ref Range   WBC 5.7 4.6 - 10.2 K/uL   Lymph, poc 2.1 0.6 - 3.4   POC LYMPH PERCENT 36.8 10 - 50 %L   MID (cbc) 0.4 0 - 0.9   POC MID % 7.0 0 - 12 %M   POC Granulocyte 3.2 2 - 6.9   Granulocyte percent 56.2 37 - 80 %G   RBC 4.97 4.04 - 5.48 M/uL   Hemoglobin 12.0 11 - 14.6 g/dL   HCT, POC 37.6 29 - 41 %   MCV 75.7 (A) 76 - 111 fL   MCH, POC 24.1 (A) 27 - 31.2 pg   MCHC 31.9 31.8 - 35.4 g/dL   RDW, POC 14.6 %   Platelet Count, POC 195 142 - 424 K/uL   MPV 8.4 0 - 99.8 fL  POCT urinalysis dipstick     Status: Abnormal   Collection Time: 12/04/18 11:25 AM  Result Value Ref Range   Color, UA yellow yellow   Clarity, UA cloudy (A) clear   Glucose, UA negative negative mg/dL   Bilirubin, UA negative negative   Ketones, POC UA negative negative mg/dL   Spec Grav, UA 1.025 1.010 - 1.025   Blood, UA trace-lysed (A) negative   pH, UA 6.5 5.0 - 8.0   Protein Ur, POC negative negative mg/dL   Urobilinogen, UA 0.2 0.2 or 1.0 E.U./dL   Nitrite, UA Negative Negative   Leukocytes, UA Negative Negative  POCT urine pregnancy     Status: Normal   Collection Time: 12/04/18 11:27 AM  Result Value Ref Range   Preg Test, Ur Negative Negative    No results found.   ASSESSMENT and PLAN  1. Right lower quadrant abdominal pain Not acute. VSS, normal WBC. Labs and [place] pending. Cont with ibu and heating pad as needed. RTC precautions given. - POCT urinalysis dipstick - POCT Microscopic Urinalysis ([facility]) - POCT urine pregnancy - POCT CBC - Comprehensive metabolic panel  2. Cervical cancer screening - Cytology - PAP  3. Screen for STD (sexually transmitted disease) - Cytology - PAP  Return for after [place].    Rutherford Guys, MD [facility] [place], [place] [address] Ph.  [phone] Fax [phone]

## 2018-12-05 LAB — COMPREHENSIVE METABOLIC PANEL
ALT: 11 IU/L (ref 0–32)
AST: 19 IU/L (ref 0–40)
Albumin/Globulin Ratio: 1.4 (ref 1.2–2.2)
Albumin: 4.3 g/dL (ref 3.9–5.0)
Alkaline Phosphatase: 57 IU/L (ref 39–117)
BUN/Creatinine Ratio: 20 (ref 9–23)
BUN: 16 mg/dL (ref 6–20)
Bilirubin Total: 0.3 mg/dL (ref 0.0–1.2)
CO2: 25 mmol/L (ref 20–29)
Calcium: 9.2 mg/dL (ref 8.7–10.2)
Chloride: 101 mmol/L (ref 96–106)
Creatinine, Ser: 0.81 mg/dL (ref 0.57–1.00)
GFR calc Af Amer: 119 mL/min/{1.73_m2} (ref >59)
GFR calc non Af Amer: 103 mL/min/{1.73_m2} (ref >59)
Globulin, Total: 3.1 g/dL (ref 1.5–4.5)
Glucose: 92 mg/dL (ref 65–99)
Potassium: 4.5 mmol/L (ref 3.5–5.2)
Sodium: 137 mmol/L (ref 134–144)
Total Protein: 7.4 g/dL (ref 6.0–8.5)

## 2018-12-06 ENCOUNTER — Telehealth: Payer: Self-pay | Admitting: Family Medicine

## 2018-12-06 ENCOUNTER — Other Ambulatory Visit: Payer: Self-pay | Admitting: Family Medicine

## 2018-12-06 ENCOUNTER — Ambulatory Visit (HOSPITAL_COMMUNITY)
Admission: RE | Admit: 2018-12-06 | Discharge: 2018-12-06 | Disposition: A | Discharge status: Home / Self Care | Payer: Federal, State, Local not specified - PPO | Source: Ambulatory Visit | Attending: Family Medicine | Admitting: Family Medicine

## 2018-12-06 ENCOUNTER — Other Ambulatory Visit: Payer: Self-pay

## 2018-12-06 DIAGNOSIS — R1031 Right lower quadrant pain: Secondary | ICD-10-CM | POA: Insufficient documentation

## 2018-12-06 NOTE — Addendum Note (Signed)
Addended by: Rutherford Guys on: 12/06/2018 03:04 PM   Modules accepted: Orders

## 2018-12-06 NOTE — Telephone Encounter (Signed)
Pt called in and advised to go to [facility] main entrance and have them direct her to radiology for [place].  Advised not to check in at ED.  Provider will be called with results and will determine tx plan at that time.

## 2018-12-06 NOTE — Telephone Encounter (Signed)
Pt needs referral for ultrasound submitted so appt can be made She also wanted to request a follow up call for stomach pain. Ibuprofen is not working. Please advise

## 2018-12-06 NOTE — Telephone Encounter (Signed)
[place] referral has been entered.  Can go to [facility] now but unable to reach pt.  Referrals attempted and I've attempted both numbers but v/m is full for one and unable to get through on the other.  Sent MyChart message.

## 2018-12-07 ENCOUNTER — Other Ambulatory Visit: Payer: Self-pay | Admitting: Family Medicine

## 2018-12-07 ENCOUNTER — Telehealth: Payer: Self-pay | Admitting: Family Medicine

## 2018-12-07 ENCOUNTER — Ambulatory Visit
Admission: RE | Admit: 2018-12-07 | Discharge: 2018-12-07 | Disposition: A | Discharge status: Home / Self Care | Payer: Federal, State, Local not specified - PPO | Source: Ambulatory Visit | Attending: Family Medicine | Admitting: Family Medicine

## 2018-12-07 DIAGNOSIS — R1031 Right lower quadrant pain: Secondary | ICD-10-CM

## 2018-12-07 MED ORDER — TRAMADOL HCL 50 MG PO TABS: 50.0000 mg | ORAL_TABLET | Freq: Three times a day (TID) | ORAL | 0 refills | Status: AC | PRN

## 2018-12-07 MED ORDER — POLYETHYLENE GLYCOL 3350 17 GM/SCOOP PO POWD: 17.0000 g | Freq: Two times a day (BID) | ORAL | 1 refills | Status: DC | PRN

## 2018-12-07 NOTE — Progress Notes (Signed)
Pelvic US normal. Ordering AXR Tramadol given for pain pmp reviewed

## 2018-12-13 NOTE — Telephone Encounter (Signed)
Pt. Requesting work note for visit on 12/04/2018. Pt. Inquires if it is possible for the doctor to also draft a note or document reasserting a diagnosis for scoliosis earlier in her life for her work.

## 2018-12-17 ENCOUNTER — Other Ambulatory Visit: Payer: Self-pay | Admitting: Family Medicine
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
## 2018-12-17 NOTE — Telephone Encounter (Signed)
Pleas see precious note.

## 2019-11-15 ENCOUNTER — Emergency Department (HOSPITAL_COMMUNITY): Payer: Federal, State, Local not specified - PPO

## 2019-11-15 ENCOUNTER — Emergency Department (HOSPITAL_COMMUNITY)
Admission: EM | Admit: 2019-11-15 | Discharge: 2019-11-15 | Disposition: A | Discharge status: Home / Self Care | Payer: Federal, State, Local not specified - PPO | Source: Home / Self Care | Attending: Emergency Medicine | Admitting: Emergency Medicine

## 2019-11-15 ENCOUNTER — Encounter (HOSPITAL_COMMUNITY): Payer: Self-pay | Admitting: Emergency Medicine

## 2019-11-15 ENCOUNTER — Emergency Department (HOSPITAL_COMMUNITY)
Admission: EM | Admit: 2019-11-15 | Discharge: 2019-11-15 | Disposition: A | Discharge status: Home / Self Care | Payer: Federal, State, Local not specified - PPO | Attending: Emergency Medicine | Admitting: Emergency Medicine

## 2019-11-15 ENCOUNTER — Other Ambulatory Visit: Payer: Self-pay

## 2019-11-15 DIAGNOSIS — M542 Cervicalgia: Secondary | ICD-10-CM | POA: Diagnosis present

## 2019-11-15 DIAGNOSIS — S39012A Strain of muscle, fascia and tendon of lower back, initial encounter: Secondary | ICD-10-CM | POA: Diagnosis not present

## 2019-11-15 DIAGNOSIS — Y92410 Unspecified street and highway as the place of occurrence of the external cause: Secondary | ICD-10-CM | POA: Diagnosis not present

## 2019-11-15 DIAGNOSIS — M545 Low back pain, unspecified: Secondary | ICD-10-CM

## 2019-11-15 DIAGNOSIS — S161XXA Strain of muscle, fascia and tendon at neck level, initial encounter: Secondary | ICD-10-CM | POA: Diagnosis not present

## 2019-11-15 LAB — I-STAT BETA HCG BLOOD, ED (MC, WL, AP ONLY): I-stat hCG, quantitative: <5 m[IU]/mL (ref <5)

## 2019-11-15 MED ORDER — ACETAMINOPHEN 325 MG PO TABS
650.0000 mg | ORAL_TABLET | Freq: Once | ORAL | Status: AC
  Administered 2019-11-15: 650 mg via ORAL
  Filled 2019-11-15: qty 2

## 2019-11-15 MED ORDER — CYCLOBENZAPRINE HCL 10 MG PO TABS: 10.0000 mg | ORAL_TABLET | Freq: Two times a day (BID) | ORAL | 0 refills | Status: AC | PRN
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
## 2019-11-15 NOTE — ED Triage Notes (Signed)
Patient seen today for same. Initially refused CT, decided she wants CT now. Denies changes in complaints. Continued neck and back pain.
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
## 2019-11-15 NOTE — ED Notes (Signed)
Patient is refusing scans and bloodwork at this time. PA made aware.
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
## 2019-11-15 NOTE — Discharge Instructions (Signed)
Your imaging looks reassuring no fracture or dislocation noted.  Please take over-the-counter pain medications as well as muscle relaxers as prescribed to you.  Please follow-up with primary care provider for further evaluation.  If symptoms worsens please come back to the emergency department for further evaluation.

## 2019-12-13 ENCOUNTER — Other Ambulatory Visit: Payer: Self-pay

## 2019-12-13 ENCOUNTER — Ambulatory Visit: Payer: Federal, State, Local not specified - PPO | Attending: Internal Medicine | Admitting: Internal Medicine

## 2019-12-13 DIAGNOSIS — M545 Low back pain, unspecified: Secondary | ICD-10-CM | POA: Diagnosis not present

## 2019-12-13 DIAGNOSIS — M546 Pain in thoracic spine: Secondary | ICD-10-CM

## 2019-12-13 DIAGNOSIS — M79604 Pain in right leg: Secondary | ICD-10-CM | POA: Diagnosis not present

## 2019-12-13 MED ORDER — CYCLOBENZAPRINE HCL 10 MG PO TABS: 10.0000 mg | ORAL_TABLET | Freq: Two times a day (BID) | ORAL | 0 refills | Status: DC | PRN

## 2019-12-13 MED ORDER — IBUPROFEN 800 MG PO TABS: 800.0000 mg | ORAL_TABLET | Freq: Three times a day (TID) | ORAL | 0 refills | Status: DC | PRN

## 2019-12-13 MED ORDER — TRAMADOL HCL 50 MG PO TABS: 50.0000 mg | ORAL_TABLET | Freq: Three times a day (TID) | ORAL | 0 refills | Status: AC | PRN
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
## 2019-12-13 NOTE — Progress Notes (Signed)
Pt states she would like a referral to physical therapy  Pt states she is taking tylenol,  Ibuprofen and motrin for the pain. Pt states sometimes she has to take more than she has to for the pain.   Pt states she has a pillow that she order from [facility] and it has gave her some relief but not a whole lot.

## 2020-03-15 ENCOUNTER — Emergency Department (HOSPITAL_COMMUNITY)
Admission: EM | Admit: 2020-03-15 | Discharge: 2020-03-15 | Disposition: A | Discharge status: Home / Self Care | Payer: Federal, State, Local not specified - PPO | Attending: Emergency Medicine | Admitting: Emergency Medicine

## 2020-03-15 ENCOUNTER — Other Ambulatory Visit: Payer: Self-pay

## 2020-03-15 ENCOUNTER — Encounter (HOSPITAL_COMMUNITY): Payer: Self-pay | Admitting: Obstetrics and Gynecology

## 2020-03-15 DIAGNOSIS — M79661 Pain in right lower leg: Secondary | ICD-10-CM | POA: Diagnosis present

## 2020-03-15 DIAGNOSIS — L03115 Cellulitis of right lower limb: Secondary | ICD-10-CM | POA: Diagnosis not present

## 2020-03-15 MED ORDER — FLUCONAZOLE 150 MG PO TABS: 150.0000 mg | ORAL_TABLET | Freq: Every day | ORAL | 0 refills | Status: DC

## 2020-03-15 MED ORDER — FLUCONAZOLE 150 MG PO TABS: 150.0000 mg | ORAL_TABLET | Freq: Every day | ORAL | 0 refills | Status: AC

## 2020-03-15 MED ORDER — ONDANSETRON 4 MG PO TBDP: 4.0000 mg | ORAL_TABLET | Freq: Three times a day (TID) | ORAL | 0 refills | Status: DC | PRN

## 2020-03-15 MED ORDER — CEPHALEXIN 250 MG PO CAPS: 250.0000 mg | ORAL_CAPSULE | Freq: Four times a day (QID) | ORAL | 0 refills | Status: AC

## 2020-03-15 NOTE — ED Triage Notes (Signed)
Patient has an area of possible cellulitis on the right leg. Patient reports she thinks she was bitten by something. Patient has noted erythema to the area with a purulent center.

## 2020-03-15 NOTE — Discharge Instructions (Addendum)
You were seen today for a bug bite, I think that this is most likely starting to develop an infection called cellulitis.  I want you to take the antibiotics as directed and follow-up with your primary care in the next couple of days.  If you do not have 1 you can go to [facility] wellness.  I also prescribed you some flucanazole as you requested for your yeast infections after you have antibiotics.  Take that after you are done with your antibiotics.  Please use the following attachments.  If you have any new worsening concerning symptoms such as the area becomes worse, you notice streaking or you have high fevers please come back to the emergency department.  You can take Tylenol as directed on the bottle for pain.  Please speak to your pharmacist about any new medications prescribed today in regards to side effects or interactions. Get help right away if: Your symptoms get worse. You feel very sleepy. You develop vomiting or diarrhea that persists. You notice red streaks coming from the infected area. Your red area gets larger or turns dark in color.
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
## 2022-09-27 ENCOUNTER — Emergency Department (HOSPITAL_COMMUNITY)
Admission: EM | Admit: 2022-09-27 | Discharge: 2022-09-28 | Disposition: A | Discharge status: Home / Self Care | Payer: Self-pay | Attending: Emergency Medicine | Admitting: Emergency Medicine

## 2022-09-27 ENCOUNTER — Encounter (HOSPITAL_COMMUNITY): Payer: Self-pay

## 2022-09-27 DIAGNOSIS — M5442 Lumbago with sciatica, left side: Secondary | ICD-10-CM | POA: Diagnosis not present

## 2022-09-27 DIAGNOSIS — Y92410 Unspecified street and highway as the place of occurrence of the external cause: Secondary | ICD-10-CM | POA: Diagnosis not present

## 2022-09-27 DIAGNOSIS — M5441 Lumbago with sciatica, right side: Secondary | ICD-10-CM | POA: Diagnosis not present

## 2022-09-27 DIAGNOSIS — M545 Low back pain, unspecified: Secondary | ICD-10-CM | POA: Diagnosis present
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
## 2022-09-28 MED ORDER — OXYCODONE-ACETAMINOPHEN 5-325 MG PO TABS
1.0000 | ORAL_TABLET | Freq: Once | ORAL | Status: AC
  Administered 2022-09-28: 1 via ORAL
  Filled 2022-09-28: qty 1

## 2022-09-28 MED ORDER — TIZANIDINE HCL 4 MG PO TABS: 4.0000 mg | ORAL_TABLET | Freq: Four times a day (QID) | ORAL | 0 refills | Status: DC | PRN

## 2022-09-28 MED ORDER — OXYCODONE HCL 5 MG PO TABS: 5.0000 mg | ORAL_TABLET | ORAL | 0 refills | Status: DC | PRN

## 2022-09-28 MED ORDER — NAPROXEN 500 MG PO TABS
500.0000 mg | ORAL_TABLET | Freq: Once | ORAL | Status: AC
  Administered 2022-09-28: 500 mg via ORAL
  Filled 2022-09-28: qty 1

## 2022-09-28 MED ORDER — TIZANIDINE HCL 4 MG PO TABS
4.0000 mg | ORAL_TABLET | Freq: Once | ORAL | Status: AC
  Administered 2022-09-28: 4 mg via ORAL
  Filled 2022-09-28: qty 1
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
## 2022-09-28 NOTE — Discharge Instructions (Addendum)
Apply ice for 30 minutes at a time, 4 times a day.  Take naproxen-2 tablets at a time, twice a day.  Take acetaminophen-2 tablets at a time, every 6 hours as needed for additional pain relief.  Take oxycodone for pain not relieved by the combination of naproxen and acetaminophen.  Return to the emergency department if you have any occasion where you lose control of your bowels or bladder.

## 2022-10-25 ENCOUNTER — Other Ambulatory Visit: Payer: Self-pay

## 2022-10-25 ENCOUNTER — Emergency Department (HOSPITAL_COMMUNITY): Payer: Self-pay

## 2022-10-25 ENCOUNTER — Emergency Department (HOSPITAL_COMMUNITY)
Admission: EM | Admit: 2022-10-25 | Discharge: 2022-10-25 | Disposition: A | Discharge status: Home / Self Care | Payer: Self-pay | Attending: Emergency Medicine | Admitting: Emergency Medicine

## 2022-10-25 ENCOUNTER — Encounter (HOSPITAL_COMMUNITY): Payer: Self-pay

## 2022-10-25 DIAGNOSIS — M5440 Lumbago with sciatica, unspecified side: Secondary | ICD-10-CM | POA: Insufficient documentation

## 2022-10-25 DIAGNOSIS — N390 Urinary tract infection, site not specified: Secondary | ICD-10-CM | POA: Insufficient documentation

## 2022-10-25 LAB — CBC WITH DIFFERENTIAL/PLATELET
Abs Immature Granulocytes: 0.01 10*3/uL (ref 0.00–0.07)
Basophils Absolute: 0 10*3/uL (ref 0.0–0.1)
Basophils Relative: 1 %
Eosinophils Absolute: 0.1 10*3/uL (ref 0.0–0.5)
Eosinophils Relative: 2 %
HCT: 43.4 % (ref 36.0–46.0)
Hemoglobin: 13 g/dL (ref 12.0–15.0)
Immature Granulocytes: 0 %
Lymphocytes Relative: 28 %
Lymphs Abs: 1.2 10*3/uL (ref 0.7–4.0)
MCH: 23.3 pg — ABNORMAL LOW (ref 26.0–34.0)
MCHC: 30 g/dL (ref 30.0–36.0)
MCV: 77.8 fL — ABNORMAL LOW (ref 80.0–100.0)
Monocytes Absolute: 0.4 10*3/uL (ref 0.1–1.0)
Monocytes Relative: 8 %
Neutro Abs: 2.6 10*3/uL (ref 1.7–7.7)
Neutrophils Relative %: 61 %
Platelets: 229 10*3/uL (ref 150–400)
RBC: 5.58 MIL/uL — ABNORMAL HIGH (ref 3.87–5.11)
RDW: 15.4 % (ref 11.5–15.5)
WBC: 4.4 10*3/uL (ref 4.0–10.5)
nRBC: 0 % (ref 0.0–0.2)

## 2022-10-25 LAB — URINALYSIS, ROUTINE W REFLEX MICROSCOPIC
Bilirubin Urine: NEGATIVE
Glucose, UA: 50 mg/dL — AB
Ketones, ur: NEGATIVE mg/dL
Nitrite: NEGATIVE
Protein, ur: NEGATIVE mg/dL
Specific Gravity, Urine: 1.023 (ref 1.005–1.030)
pH: 6 (ref 5.0–8.0)

## 2022-10-25 LAB — COMPREHENSIVE METABOLIC PANEL
ALT: 13 U/L (ref 0–44)
AST: 20 U/L (ref 15–41)
Albumin: 3.9 g/dL (ref 3.5–5.0)
Alkaline Phosphatase: 67 U/L (ref 38–126)
Anion gap: 7 (ref 5–15)
BUN: 9 mg/dL (ref 6–20)
CO2: 25 mmol/L (ref 22–32)
Calcium: 8.8 mg/dL — ABNORMAL LOW (ref 8.9–10.3)
Chloride: 105 mmol/L (ref 98–111)
Creatinine, Ser: 0.83 mg/dL (ref 0.44–1.00)
GFR, Estimated: >60 mL/min (ref >60)
Glucose, Bld: 91 mg/dL (ref 70–99)
Potassium: 3.5 mmol/L (ref 3.5–5.1)
Sodium: 137 mmol/L (ref 135–145)
Total Bilirubin: 0.6 mg/dL (ref 0.3–1.2)
Total Protein: 7.8 g/dL (ref 6.5–8.1)

## 2022-10-25 LAB — WET PREP, GENITAL
Clue Cells Wet Prep HPF POC: NONE SEEN
Sperm: NONE SEEN
Trich, Wet Prep: NONE SEEN
WBC, Wet Prep HPF POC: <10 (ref <10)
Yeast Wet Prep HPF POC: NONE SEEN

## 2022-10-25 LAB — HCG, SERUM, QUALITATIVE: Preg, Serum: NEGATIVE

## 2022-10-25 LAB — LIPASE, BLOOD: Lipase: 45 U/L (ref 11–51)

## 2022-10-25 LAB — TROPONIN I (HIGH SENSITIVITY): Troponin I (High Sensitivity): 2 ng/L (ref <18)

## 2022-10-25 MED ORDER — CEPHALEXIN 500 MG PO CAPS: 500.0000 mg | ORAL_CAPSULE | Freq: Four times a day (QID) | ORAL | 0 refills | Status: DC

## 2022-10-25 MED ORDER — LIDOCAINE 5 % EX PTCH
1.0000 | MEDICATED_PATCH | CUTANEOUS | Status: DC
  Administered 2022-10-25: 1 via TRANSDERMAL
  Filled 2022-10-25: qty 1

## 2022-10-25 MED ORDER — IOHEXOL 300 MG/ML  SOLN
100.0000 mL | Freq: Once | INTRAMUSCULAR | Status: AC | PRN
  Administered 2022-10-25: 100 mL via INTRAVENOUS

## 2022-10-25 MED ORDER — ONDANSETRON HCL 4 MG PO TABS: 4.0000 mg | ORAL_TABLET | Freq: Four times a day (QID) | ORAL | 0 refills | Status: DC | PRN

## 2022-10-25 MED ORDER — CEPHALEXIN 500 MG PO CAPS
500.0000 mg | ORAL_CAPSULE | Freq: Once | ORAL | Status: AC
  Administered 2022-10-25: 500 mg via ORAL
  Filled 2022-10-25: qty 1

## 2022-10-25 MED ORDER — FENTANYL CITRATE PF 50 MCG/ML IJ SOSY
50.0000 ug | PREFILLED_SYRINGE | Freq: Once | INTRAMUSCULAR | Status: AC
  Administered 2022-10-25: 50 ug via INTRAVENOUS
  Filled 2022-10-25: qty 1

## 2022-10-25 MED ORDER — SODIUM CHLORIDE (PF) 0.9 % IJ SOLN
INTRAMUSCULAR | Status: AC
  Filled 2022-10-25: qty 50

## 2022-10-25 NOTE — ED Triage Notes (Signed)
Pt reports with lower abdominal pain and lower back pain since yesterday. Pt reports having sciatica.

## 2022-10-25 NOTE — Discharge Instructions (Addendum)
It was a pleasure taking part in your care today.  As we discussed, you have a UTI based on CT scan.  I am starting on an antibiotic called Keflex.  You will take this 4 times a day for the next 7 days.  We are also culturing your urine.  If your urine grows a bacteria that is not covered by the antibiotic I am placing you on, a pharmacist will call you tomorrow and placed you on the correct antibiotic.  You may take Zofran every 6 hours as needed for nausea and vomiting.  Please return to the ED with any new or worsening symptoms such as fevers, intractable nausea or vomiting.  Please follow-up with neurosurgery for your low back pain.
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
## 2022-10-26 LAB — URINE CULTURE: Culture: >=100000 — AB

## 2022-10-27 ENCOUNTER — Telehealth (HOSPITAL_BASED_OUTPATIENT_CLINIC_OR_DEPARTMENT_OTHER): Payer: Self-pay

## 2022-10-27 NOTE — Progress Notes (Addendum)
ED Antimicrobial Stewardship Positive Culture Follow Up   [name] [name] is an 27 y.o. female who presented to [facility] on 10/25/2022 with a chief complaint of  Chief Complaint  Patient presents with   Abdominal Pain   Back Pain    Recent Results (from the past 720 hour(s))  Wet prep, genital     Status: None   Collection Time: 10/25/22  7:40 AM  Result Value Ref Range Status   Yeast Wet Prep HPF POC NONE SEEN NONE SEEN Final   Trich, Wet Prep NONE SEEN NONE SEEN Final   Clue Cells Wet Prep HPF POC NONE SEEN NONE SEEN Final   WBC, Wet Prep HPF POC <10 <10 Final   Sperm NONE SEEN  Final    Comment: Performed at [facility], [address] [place], [place] [address]  Urine Culture (for pregnant, neutropenic or urologic patients or patients with an indwelling urinary catheter)     Status: Abnormal   Collection Time: 10/25/22  8:55 AM   Specimen: Urine, Clean Catch  Result Value Ref Range Status   Specimen Description   Final    URINE, CLEAN CATCH Performed at [facility], [address], [place], [place] [address]    Special Requests   Final    NONE Performed at [facility], [address], [place], [place] [address]    Culture (A)  Final    >=100,000 COLONIES/mL GROUP B STREP(S.AGALACTIAE)ISOLATED TESTING AGAINST S. AGALACTIAE NOT ROUTINELY PERFORMED DUE TO PREDICTABILITY OF AMP/PEN/VAN SUSCEPTIBILITY. Performed at [facility], [address] [place], [place] [address]    Report Status 10/26/2022 FINAL  Final    Discharged with cephalexin 500mg  PO x 7 days.  Patient presented with nonspecific symptoms (chronic low back pain, new abdominal pain, RS chest pain). Workup was WNL except CT abdomen revealed bladder wall thickening suggestive of cystitis, a nonspecific marker for UTI. UA contained 6-10 squamous cells and urine culture now positive for Group B strep, which is likely colonizer or contaminate. Recommend  discontinuation of antibiotics.    ED Provider: Ralph Leyden, PA-C   Stephenie Acres, PharmD PGY1 Pharmacy Resident 10/27/2022 9:54 AM

## 2022-10-27 NOTE — Telephone Encounter (Signed)
Post ED Visit - Positive Culture Follow-up: Unsuccessful Patient Follow-up  Culture assessed and recommendations reviewed by:  []  Enzo Bi, Pharm.D. []  Celedonio Miyamoto, Pharm.D., BCPS AQ-ID []  Garvin Fila, Pharm.D., BCPS []  Georgina Pillion, Pharm.D., BCPS []  [place], [address].D., BCPS, AAHIVP []  Estella Husk, Pharm.D., BCPS, AAHIVP []  Sherlynn Carbon, PharmD []  Pollyann Samples, PharmD, BCPS  Positive urine culture  []  Patient discharged without antimicrobial prescription and treatment is now indicated []  Organism is resistant to prescribed ED discharge antimicrobial []  Patient with positive blood cultures   Plan: D/C Keflex, not needed per ED provider Britini Henderley, PA-C    Unable to contact patient after 3 attempts, letter will be sent to address on file  Sandria Senter 10/27/2022, 12:44 PM

## 2023-01-02 ENCOUNTER — Ambulatory Visit (INDEPENDENT_AMBULATORY_CARE_PROVIDER_SITE_OTHER): Payer: No Payment, Other | Admitting: Licensed Clinical Social Worker

## 2023-01-02 DIAGNOSIS — F411 Generalized anxiety disorder: Secondary | ICD-10-CM

## 2023-01-02 DIAGNOSIS — F332 Major depressive disorder, recurrent severe without psychotic features: Secondary | ICD-10-CM

## 2023-01-02 DIAGNOSIS — F4310 Post-traumatic stress disorder, unspecified: Secondary | ICD-10-CM
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
## 2023-01-30 ENCOUNTER — Encounter (HOSPITAL_COMMUNITY): Payer: Self-pay | Admitting: Psychiatry

## 2023-01-30 ENCOUNTER — Telehealth (HOSPITAL_COMMUNITY): Payer: No Payment, Other | Admitting: Psychiatry

## 2023-01-30 DIAGNOSIS — F411 Generalized anxiety disorder: Secondary | ICD-10-CM

## 2023-01-30 DIAGNOSIS — F332 Major depressive disorder, recurrent severe without psychotic features: Secondary | ICD-10-CM

## 2023-01-30 DIAGNOSIS — F329 Major depressive disorder, single episode, unspecified: Secondary | ICD-10-CM

## 2023-01-30 DIAGNOSIS — F4310 Post-traumatic stress disorder, unspecified: Secondary | ICD-10-CM | POA: Diagnosis not present

## 2023-01-30 DIAGNOSIS — D509 Iron deficiency anemia, unspecified: Secondary | ICD-10-CM

## 2023-01-30 DIAGNOSIS — Z8669 Personal history of other diseases of the nervous system and sense organs: Secondary | ICD-10-CM

## 2023-01-30 MED ORDER — DULOXETINE HCL 20 MG PO CPEP: 20.0000 mg | ORAL_CAPSULE | Freq: Every day | ORAL | 2 refills | Status: DC

## 2023-01-30 NOTE — Patient Instructions (Addendum)
Thank you for attending your appointment today.  -- START Cymbalta 20 mg daily  -- I have sent this medication to [facility] to be mailed to you. Their number is [phone]. -- I have attached information about Cymbalta and the other medication, Effexor, we discussed today -- Continue other medications as prescribed.  Please do not make any changes to medications without first discussing with your provider. If you are experiencing a psychiatric emergency, please call 911 or present to your nearest emergency department. Additional crisis, medication management, and therapy resources are included below.  [facility]  [address] [place], [place] [address] [phone] WALK-IN URGENT CARE 24/7 FOR ANYONE [address], [place], [place]  [phone] Fax: [phone] [email or web address] *Interpreters available *Accepts all insurance and uninsured for Urgent Care needs *Accepts Medicaid and uninsured for outpatient treatment (below)      ONLY FOR [facility]  Below:    Outpatient New Patient Assessment/Therapy Walk-ins:        Monday, Wednesday, and Thursday 8am until slots are full (first come, first served)                   New Patient Psychiatry/Medication Management        Monday-Friday 8am-11am (first come, first served)               For all walk-ins we ask that you arrive by 7:15am, because patients will be seen in the order of arrival.

## 2023-02-24 ENCOUNTER — Ambulatory Visit (HOSPITAL_COMMUNITY): Payer: No Payment, Other | Admitting: Clinical

## 2023-02-24 DIAGNOSIS — F411 Generalized anxiety disorder: Secondary | ICD-10-CM
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
## 2023-02-27 ENCOUNTER — Telehealth (HOSPITAL_COMMUNITY): Payer: Self-pay | Admitting: Psychiatry

## 2023-02-27 ENCOUNTER — Other Ambulatory Visit (HOSPITAL_COMMUNITY): Payer: No Payment, Other

## 2023-02-27 ENCOUNTER — Telehealth (HOSPITAL_COMMUNITY): Payer: Self-pay | Admitting: Clinical

## 2023-02-28 ENCOUNTER — Telehealth (HOSPITAL_COMMUNITY): Payer: Self-pay | Admitting: Psychiatry

## 2023-02-28 MED ORDER — HYDROXYZINE HCL 25 MG PO TABS: 25.0000 mg | ORAL_TABLET | Freq: Two times a day (BID) | ORAL | 0 refills | Status: DC | PRN

## 2023-02-28 NOTE — Telephone Encounter (Signed)
 Received message from patient's therapist that she has been experiencing side effects to Cymbalta .   Called patient for approx. 15 minute phone call: She reports that she started Cymbalta  about 1 week ago and since that time has been experiencing worsened anxiety, insomnia with frequent nighttime awakenings, tachycardia (doesn't have smart watch to verify but feels her heart racing), and constipation. Reports taking Cymbalta  in the morning and denies any missed doses. Reports feeling frustrated that she is experiencing side effects but denies any risky/impulsive behaviors. She feels that side effects are overall tolerable for time being and is amenable to remaining on medication while starting PRN to help with acute anxiety and insomnia. Amenable to starting Atarax  25-50 mg BID PRN anxiety/sleep in the interim while she adjusts to Cymbalta . Encouraged patient to reach back out to clinic if symptoms persist or worsen or to discuss with therapist at appt next week who can contact this provider.  [name] DELENA PUMMEL, MD 02/28/23

## 2023-03-01 NOTE — Progress Notes (Signed)
Patient tolerated labs well.

## 2023-03-07 ENCOUNTER — Telehealth (HOSPITAL_COMMUNITY): Payer: Self-pay | Admitting: *Deleted

## 2023-03-07 ENCOUNTER — Ambulatory Visit (HOSPITAL_COMMUNITY): Payer: No Payment, Other | Admitting: Clinical

## 2023-03-07 DIAGNOSIS — F411 Generalized anxiety disorder: Secondary | ICD-10-CM

## 2023-03-07 NOTE — Telephone Encounter (Signed)
 Patient called states she is still experiencing shock waves through her chest and not sleeping. Has a lot of emotional/panic attacks through out her day. Had to leave work because of the anxiety. Does not feel like her medication is working. Also having vision issues, blurry vision at times. Sharp pain in her eye at the corner. Some muscle spasms in her sleep. States she is only sleeping 1 hour a night. Does not want her medication increased but wanted to let her MD know so she can come up with a plan.

## 2023-03-08 ENCOUNTER — Telehealth (HOSPITAL_COMMUNITY): Payer: Self-pay | Admitting: Psychiatry

## 2023-03-08 ENCOUNTER — Telehealth (HOSPITAL_COMMUNITY): Payer: Self-pay | Admitting: Professional

## 2023-03-08 NOTE — Progress Notes (Signed)
 THERAPIST PROGRESS NOTE Virtual Visit via Video Note  I connected with Gudrun Palazzi on 03/06/2022 at 11:00 AM EST by a video enabled telemedicine application and verified that I am speaking with the correct person using two identifiers.  Location: Patient: work Provider: office   I discussed the limitations of evaluation and management by telemedicine and the availability of in person appointments. The patient expressed understanding and agreed to proceed.   Follow Up Instructions: I discussed the assessment and treatment plan with the patient. The patient was provided an opportunity to ask questions and all were answered. The patient agreed with the plan and demonstrated an understanding of the instructions.   The patient was advised to call back or seek an in-person evaluation if the symptoms worsen or if the condition fails to improve as anticipated.   Session Time:  30 minutes  Participation Level: Active  Behavioral Response: CasualAlertAnxious  Type of Therapy: Individual Therapy  Treatment Goals addressed: Annalei will practice problem solving skills 3 times per week for the next 4 weeks.   ProgressTowards Goals: Progressing  Interventions: CBT  Summary:  Hadassa Cermak is a 28 y.o. female who presents for the scheduled appointment oriented x 5, appropriately dressed, and friendly.  Client denied hallucinations and delusions. Client reported on today she is doing about the same.  Client reported she did get in contact with the psychiatrist about changing her medications.  Client reported however she is continuing to have side effects of chest tightness and blurry vision.  Client reported her emotions to come in waves.  Client reported she still has a high amount of stress causes her to have panic attacks during the day.  Client reported she has a lot going going such as a court date tomorrow related to a prior speeding ticket.  Client reported she is still waiting to hear  back from her apartment complex about their decision to put her on a payment plan or if she will be evicted.  Client reported the thought of going back home and living with her parents because her bladder stressed because they are a trigger for her mental health.  Client reported she has a pattern of thinking about the future and feeling overwhelmed with since her into a spiral. Evidence of progress towards goal: Client reported 1 negative thought pattern of thinking about the future based on her current circumstances causes her to have panic attacks.  Suicidal/Homicidal: Nowithout intent/plan  Therapist Response:  Therapist began the appointment asking the client how she has been doing since last seen. Therapist used CBT to engage with active listening and positive emotional support. Therapist used CBT to ask the client about her response to medication regimen compared to ongoing symptoms. Therapist used CBT to teach the client about reframing her thoughts and breaking tasks down into smaller manageable items to address. Therapist used CBT to teach client about coping skills for anxiety. Therapist used CBT ask the client to identify her progress with frequency of use with coping skills with continued practice in her daily activity.      Plan: Return again in 3 weeks.  Diagnosis: GAD  Collaboration of Care: Patient refused AEB none requested by the client.  Patient/Guardian was advised Release of Information must be obtained prior to any record release in order to collaborate their care with an outside provider. Patient/Guardian was advised if they have not already done so to contact the registration department to sign all necessary forms in order for us  to  release information regarding their care.   Consent: Patient/Guardian gives verbal consent for treatment and assignment of benefits for services provided during this visit. Patient/Guardian expressed understanding and agreed to proceed.    Lorilynn Lehr Y Orley Lawry, LCSW 03/07/2023

## 2023-03-08 NOTE — Telephone Encounter (Signed)
 Received message from clinical staff that patient reached out reporting continued side effects to Cymbalta .  Called patient back for approximately 25-minute phone call: [name] [name] reports she has continued to take Cymbalta  20 mg daily however has been experiencing "shock waves" in her head and chest.  Reports worsening anxiety, panic attacks (increased from 2 prior to medication to 4 daily), and has had to call out of work for mental health days.  Reports she is sleeping 1-2 hours at night (states previously she was sleeping 3-4 hours).  She denies any missed doses of Cymbalta .  She denies any benefit from hydroxyzine  up to 50 mg twice daily.  Attempted to have patient walk me through her nighttime routine and sleep habits, however patient had difficulty providing details with frequent rumination on anxiety symptoms.  She denies SI.  Given persistent side effects to Cymbalta , instructed patient to discontinue Cymbalta  at this time.  Patient then disclosed that she has not taken Cymbalta  for the past 2 days.  Given concern that Cymbalta  was worsening anxiety and panic attacks, recommended stopping Cymbalta  in order to obtain complete washout from medication and can consider alternative options to help anxiety and sleep.   This Clinical research associate introduced option of starting melatonin and/or Remeron however patient reports she has tried both of these medications without effect.  Upon further exploration of Remeron trial, she states she only took it for 2 days.  Discussed timeline to effect, however patient reports she experienced side effects and does not want to be on a medication she has tried in the past. She reports reading a lot about her medications and potential side effects.  Discussed that exploration of alternative medications will need to be done during formal visit and that patient is scheduled for follow-up with this writer in 3 weeks.  She expresses anxiety around waiting this long and feels she needs more  immediate relief.  She feels anxiety and panic attacks are at the point that she would consider voluntary hospitalization however does not want to leave her dog alone at home.  No acute safety concerns that are felt to warrant involuntary hospitalization. Discussed IOP and she is amenable to referral at this time.   Plan: -- Referral placed to IOP -- STOP Cymbalta  20 mg daily -- Continue hydroxyzine  25 to 50 mg twice daily as needed panic attacks/sleep  [name] Hones, MD 03/08/23

## 2023-03-09 ENCOUNTER — Ambulatory Visit (HOSPITAL_COMMUNITY): Payer: No Payment, Other

## 2023-03-09 ENCOUNTER — Other Ambulatory Visit (HOSPITAL_COMMUNITY): Payer: Self-pay | Admitting: Psychiatry

## 2023-03-09 ENCOUNTER — Encounter (HOSPITAL_COMMUNITY): Payer: Self-pay

## 2023-03-09 ENCOUNTER — Telehealth (HOSPITAL_COMMUNITY): Payer: Self-pay | Admitting: Professional

## 2023-03-09 DIAGNOSIS — Z8669 Personal history of other diseases of the nervous system and sense organs: Secondary | ICD-10-CM

## 2023-03-09 DIAGNOSIS — D509 Iron deficiency anemia, unspecified: Secondary | ICD-10-CM

## 2023-03-09 NOTE — Telephone Encounter (Signed)
Pt rtn cln call. Pt reports she was on Caregility but disconnected because no one was there. Cln reminded pt that we discussed using Microsoft Teams for the apt and that pt would get an email with the invite this morning. Pt reports she forgot. Cln states we can move fwd with the CCA if pt wants, even though it is 20 minutes past apt time, or can reschedule. Pt reports she wants to make sure that she gets services and she doesn't want to delay starting treatment. Cln agrees to meet pt in Teams room and start CCA. Pt then asks about medications, as she did in the original scheduling call, and this cln reminds pt that PHP is focused on coping skills and medications can be adjusted via our medication providers. Pt reports she needs something that will help her immediately and knows the skills take a while to learn and use. Pt reports a lot of panic attacks, being unable to function, and being unable to sleep. Pt reports Xanax and Klonopin have worked for her in the past. Cln explains that [facility] providers will not prescribe benzos due to the short term care of PHP and that she would need to speak with her regular psychiatrist, Dr. Josephina Shih, about that type of medication. Pt reports she has spoken with Dr. Josephina Shih but she did not rx it. "You know yourself better than anyone else and I know it works for me. And I'm not scamming or anything and I know that's the stigma these days and I hate that. But that's not me." Cln again reports a psychiatrist or PCP following the pt for longer than PHP would need to rx a benzo. Cln provides information for [facility] and [facility] for a PCP due to pt not having one. Pt asks if they have a wait list and cln reports not knowing but encourages pt to call ASAP.  Pt requests referrals for psychiatrists that will rx benzos and cln explains that the only referrals for people w/o insurance in [facility] is [facility], or pt will need to pay out of pocket for the apts. Cln also  explains that cln is unaware of which providers will rx benzos. Pt asks if she can continue with therapy, both individual and PHP, at [facility] if she goes somewhere else for psychiatry and cln explains yes, and again the fund for indigent pts is only covered at [facility] so she would have to pay out of pocket if she goes somewhere else. Pt reports she wants to reschedule her CCA for PHP. Pt rescheduled. Pt reports she will be at work at 10a on Monday but should be able to have a confidential space to complete the 1-1.5 hour CCA at that time. Pt denies SI/HI.

## 2023-03-13 ENCOUNTER — Ambulatory Visit (HOSPITAL_COMMUNITY): Payer: No Payment, Other

## 2023-03-13 ENCOUNTER — Telehealth (HOSPITAL_COMMUNITY): Payer: Self-pay | Admitting: Professional

## 2023-03-13 ENCOUNTER — Encounter (HOSPITAL_COMMUNITY): Payer: Self-pay

## 2023-03-16 ENCOUNTER — Telehealth (HOSPITAL_COMMUNITY): Payer: Self-pay | Admitting: Clinical

## 2023-03-21 ENCOUNTER — Telehealth (HOSPITAL_COMMUNITY): Payer: Self-pay | Admitting: Clinical

## 2023-03-21 NOTE — Telephone Encounter (Signed)
Therapist returned a telephone call to the client regarding a message she left for the therapist to write her a letter for her absence from court.  Client reported due to her single case she is not able to have an appointment lawyer.  Client reported she needs documentation supporting her diagnosis to excuse her from the court date.  Client reported she has plans to resolve the issue outside of court with the respective parties.  Therapist informed client an appropriate letter will be written and she will be contacted at a later date to receive the letter.
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
## 2023-03-27 ENCOUNTER — Telehealth (INDEPENDENT_AMBULATORY_CARE_PROVIDER_SITE_OTHER): Payer: No Payment, Other | Admitting: Psychiatry

## 2023-03-27 ENCOUNTER — Encounter (HOSPITAL_COMMUNITY): Payer: Self-pay | Admitting: Psychiatry

## 2023-03-27 DIAGNOSIS — F411 Generalized anxiety disorder: Secondary | ICD-10-CM

## 2023-03-27 DIAGNOSIS — F332 Major depressive disorder, recurrent severe without psychotic features: Secondary | ICD-10-CM

## 2023-03-27 DIAGNOSIS — F4310 Post-traumatic stress disorder, unspecified: Secondary | ICD-10-CM | POA: Diagnosis not present

## 2023-03-27 MED ORDER — QUETIAPINE FUMARATE 25 MG PO TABS: 12.5000 mg | ORAL_TABLET | Freq: Two times a day (BID) | ORAL | 1 refills | Status: DC | PRN

## 2023-03-27 MED ORDER — CITALOPRAM HYDROBROMIDE 10 MG PO TABS: ORAL_TABLET | ORAL | 1 refills | Status: DC

## 2023-03-27 NOTE — Patient Instructions (Signed)
Thank you for attending your appointment today.  -- STOP Atarax -- START Celexa 5 mg daily for 1 week then INCREASE to 10 mg daily -- START Seroquel 12.5-25 mg up to twice daily as needed for panic attacks -- Continue other medications as prescribed.  Please do not make any changes to medications without first discussing with your provider. If you are experiencing a psychiatric emergency, please call 911 or present to your nearest emergency department. Additional crisis, medication management, and therapy resources are included below.  [facility]  [address], [place], [place] [address] [phone] WALK-IN URGENT CARE 24/7 FOR ANYONE [address], [place], [place]  [phone] Fax: [phone] [email or web address] *Interpreters available *Accepts all insurance and uninsured for Urgent Care needs *Accepts Medicaid and uninsured for outpatient treatment (below)      ONLY FOR [facility]  Below:    Outpatient New Patient Assessment/Therapy Walk-ins:        Monday, Wednesday, and Thursday 8am until slots are full (first come, first served)                   New Patient Psychiatry/Medication Management        Monday-Friday 8am-11am (first come, first served)               For all walk-ins we ask that you arrive by 7:15am, because patients will be seen in the order of arrival.

## 2023-03-29 ENCOUNTER — Ambulatory Visit (HOSPITAL_COMMUNITY): Payer: No Payment, Other | Admitting: Clinical

## 2023-03-29 DIAGNOSIS — F332 Major depressive disorder, recurrent severe without psychotic features: Secondary | ICD-10-CM

## 2023-03-29 NOTE — Progress Notes (Signed)
 THERAPIST PROGRESS NOTE Virtual Visit via Video Note  I connected with Arrabella Blumenstock on 03/29/2023 at  3:00 PM EST by a video enabled telemedicine application and verified that I am speaking with the correct person using two identifiers.  Location: Patient: home Provider: office   I discussed the limitations of evaluation and management by telemedicine and the availability of in person appointments. The patient expressed understanding and agreed to proceed.   Follow Up Instructions: I discussed the assessment and treatment plan with the patient. The patient was provided an opportunity to ask questions and all were answered. The patient agreed with the plan and demonstrated an understanding of the instructions.   The patient was advised to call back or seek an in-person evaluation if the symptoms worsen or if the condition fails to improve as anticipated.   Session Time: 25 minutes  Participation Level: Active  Behavioral Response: CasualAlertDepressed  Type of Therapy: Individual Therapy  Treatment Goals addressed: Paiton will practice problem solving skills 3 times per week for the next 4 weeks.   ProgressTowards Goals: Progressing  Interventions: CBT  Summary:  Marilyne Haseley is a 28 y.o. female who presents for the scheduled appointment oriented x 5, appropriately dressed, and friendly. Client reported she is not doing well.  Client reported she has had a lot of stress about her job, financial situation, and her residence.  Client reported the court date is coming up next week for her eviction notice.  Client reported because of specific case that she is not able to have a representation.  Client reported she is anxious about how it will go and is starting to pack up her place.  Client reported she is coming to the realization that she will probably be going back home to stay with her parents for a while.  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  Client reported she told her mom about the court date and her mom will try to come to be present. Evidence of progress towards goal: Client reported 1 positive of using problem-solving skills to help get through her current stressors.   Suicidal/Homicidal: Nowithout intent/plan  Therapist Response:  Therapist began the appointment asking the client how she is been doing since last seen. Therapist used CBT to engage with active listening and positive emotional support. Therapist used CBT to give the client time to express her thoughts and feelings about her current stressors. Therapist used CBT to engage with the client to normalize her emotional response within the and offer positive reframing of working through difficult situations. Therapist used CBT ask the client to identify her progress with frequency of use with coping skills with continued practice in her daily activity.    Therapist assigned the client homework to practice self-care.   Plan: Return again in 4 weeks.  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  Patient/Guardian was advised Release of Information must be obtained prior to any record release in order to collaborate their care with an outside provider. Patient/Guardian was advised if they have not already done so to contact the registration department to sign all necessary forms in order for us  to release  information regarding their care.   Consent: Patient/Guardian gives verbal consent for treatment and assignment of benefits for services provided during this visit. Patient/Guardian expressed understanding and agreed to  proceed.   Jibreel Fedewa Y Ronal Maybury, LCSW 03/29/2023
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
## 2023-05-10 ENCOUNTER — Telehealth (HOSPITAL_COMMUNITY): Payer: No Payment, Other | Admitting: Psychiatry

## 2023-05-10 ENCOUNTER — Encounter (HOSPITAL_COMMUNITY): Payer: Self-pay | Admitting: Psychiatry

## 2023-05-10 ENCOUNTER — Encounter (HOSPITAL_COMMUNITY): Payer: Self-pay

## 2023-05-10 DIAGNOSIS — F410 Panic disorder [episodic paroxysmal anxiety] without agoraphobia: Secondary | ICD-10-CM

## 2023-05-10 DIAGNOSIS — F4310 Post-traumatic stress disorder, unspecified: Secondary | ICD-10-CM

## 2023-05-10 DIAGNOSIS — F411 Generalized anxiety disorder: Secondary | ICD-10-CM | POA: Diagnosis not present

## 2023-05-10 DIAGNOSIS — F332 Major depressive disorder, recurrent severe without psychotic features: Secondary | ICD-10-CM | POA: Diagnosis not present

## 2023-05-10 MED ORDER — QUETIAPINE FUMARATE 25 MG PO TABS: 25.0000 mg | ORAL_TABLET | Freq: Two times a day (BID) | ORAL | 2 refills | Status: AC | PRN

## 2023-05-10 MED ORDER — QUETIAPINE FUMARATE 50 MG PO TABS: 50.0000 mg | ORAL_TABLET | Freq: Every evening | ORAL | 2 refills | Status: AC

## 2023-05-10 NOTE — Patient Instructions (Signed)
 Thank you for attending your appointment today.  -- Do NOT restart Celexa -- INCREASE Seroquel to 50-100 mg nightly; continue Seroquel 25 mg up to twice daily as needed for anxiety/panic attacks -- Continue other medications as prescribed.  Please do not make any changes to medications without first discussing with your provider. If you are experiencing a psychiatric emergency, please call 911 or present to your nearest emergency department. Additional crisis, medication management, and therapy resources are included below.  [facility]  [address], [place], [place] [address] [phone] WALK-IN URGENT CARE 24/7 FOR ANYONE [address], [place], [place]  [phone] Fax: [phone] [email or web address] *Interpreters available *Accepts all insurance and uninsured for Urgent Care needs *Accepts Medicaid and uninsured for outpatient treatment (below)      ONLY FOR [facility]  Below:    Outpatient New Patient Assessment/Therapy Walk-ins:        Monday, Wednesday, and Thursday 8am until slots are full (first come, first served)                   New Patient Psychiatry/Medication Management        Monday-Friday 8am-11am (first come, first served)               For all walk-ins we ask that you arrive by 7:15am, because patients will be seen in the order of arrival.
